# Patient Record
Sex: Male | Born: 1960 | Race: White | Hispanic: No | Marital: Married | Smoking: Current every day smoker
Health system: Southern US, Community
[De-identification: ages and names within clinical notes are randomized; demographics above are authoritative.]

## PROBLEM LIST (undated history)

## (undated) DIAGNOSIS — I10 Essential (primary) hypertension: Secondary | ICD-10-CM

## (undated) DIAGNOSIS — K5792 Diverticulitis of intestine, part unspecified, without perforation or abscess without bleeding: Secondary | ICD-10-CM

## (undated) HISTORY — PX: APPENDECTOMY: SHX54

## (undated) HISTORY — PX: HERNIA REPAIR: SHX51

---

## 2001-08-13 ENCOUNTER — Encounter: Payer: Self-pay | Admitting: Family Medicine

## 2001-08-13 ENCOUNTER — Ambulatory Visit (HOSPITAL_COMMUNITY): Admission: RE | Admit: 2001-08-13 | Discharge: 2001-08-13 | Payer: Self-pay | Admitting: Family Medicine

## 2002-05-19 ENCOUNTER — Emergency Department (HOSPITAL_COMMUNITY): Admission: EM | Admit: 2002-05-19 | Discharge: 2002-05-19 | Payer: Self-pay | Admitting: Emergency Medicine

## 2003-06-12 ENCOUNTER — Emergency Department (HOSPITAL_COMMUNITY): Admission: EM | Admit: 2003-06-12 | Discharge: 2003-06-12 | Payer: Self-pay | Admitting: *Deleted

## 2004-02-27 ENCOUNTER — Ambulatory Visit (HOSPITAL_COMMUNITY): Admission: RE | Admit: 2004-02-27 | Discharge: 2004-02-27 | Payer: Self-pay | Admitting: Preventative Medicine

## 2004-05-11 ENCOUNTER — Emergency Department (HOSPITAL_COMMUNITY): Admission: EM | Admit: 2004-05-11 | Discharge: 2004-05-11 | Payer: Self-pay | Admitting: Emergency Medicine

## 2006-05-27 ENCOUNTER — Emergency Department (HOSPITAL_COMMUNITY): Admission: EM | Admit: 2006-05-27 | Discharge: 2006-05-27 | Payer: Self-pay | Admitting: Emergency Medicine

## 2009-05-09 ENCOUNTER — Emergency Department (HOSPITAL_COMMUNITY): Admission: EM | Admit: 2009-05-09 | Discharge: 2009-05-09 | Payer: Self-pay | Admitting: Emergency Medicine

## 2012-05-14 ENCOUNTER — Other Ambulatory Visit (HOSPITAL_COMMUNITY): Payer: Self-pay | Admitting: Preventative Medicine

## 2012-05-14 ENCOUNTER — Ambulatory Visit (HOSPITAL_COMMUNITY): Payer: Self-pay

## 2012-05-14 ENCOUNTER — Ambulatory Visit (HOSPITAL_COMMUNITY)
Admission: RE | Admit: 2012-05-14 | Discharge: 2012-05-14 | Disposition: A | Discharge status: Home / Self Care | Payer: Managed Care, Other (non HMO) | Source: Ambulatory Visit | Attending: Preventative Medicine | Admitting: Preventative Medicine

## 2012-05-14 DIAGNOSIS — K429 Umbilical hernia without obstruction or gangrene: Secondary | ICD-10-CM | POA: Insufficient documentation

## 2012-05-14 DIAGNOSIS — R1031 Right lower quadrant pain: Secondary | ICD-10-CM | POA: Insufficient documentation

## 2012-05-14 DIAGNOSIS — R10829 Rebound abdominal tenderness, unspecified site: Secondary | ICD-10-CM

## 2012-05-14 DIAGNOSIS — K5732 Diverticulitis of large intestine without perforation or abscess without bleeding: Secondary | ICD-10-CM | POA: Insufficient documentation

## 2012-05-14 DIAGNOSIS — K5730 Diverticulosis of large intestine without perforation or abscess without bleeding: Secondary | ICD-10-CM | POA: Insufficient documentation

## 2012-05-14 DIAGNOSIS — R10819 Abdominal tenderness, unspecified site: Secondary | ICD-10-CM | POA: Insufficient documentation

## 2012-05-14 MED ORDER — IOHEXOL 300 MG/ML  SOLN
100.0000 mL | Freq: Once | INTRAMUSCULAR | Status: AC | PRN
  Administered 2012-05-14: 100 mL via INTRAVENOUS

## 2012-07-23 ENCOUNTER — Encounter (HOSPITAL_COMMUNITY): Payer: Self-pay | Admitting: *Deleted

## 2012-07-23 ENCOUNTER — Emergency Department (HOSPITAL_COMMUNITY): Payer: Managed Care, Other (non HMO)

## 2012-07-23 ENCOUNTER — Emergency Department (HOSPITAL_COMMUNITY)
Admission: EM | Admit: 2012-07-23 | Discharge: 2012-07-23 | Disposition: A | Discharge status: Home / Self Care | Payer: Managed Care, Other (non HMO) | Attending: Emergency Medicine | Admitting: Emergency Medicine

## 2012-07-23 DIAGNOSIS — R202 Paresthesia of skin: Secondary | ICD-10-CM

## 2012-07-23 DIAGNOSIS — R51 Headache: Secondary | ICD-10-CM | POA: Insufficient documentation

## 2012-07-23 DIAGNOSIS — R519 Headache, unspecified: Secondary | ICD-10-CM

## 2012-07-23 DIAGNOSIS — R209 Unspecified disturbances of skin sensation: Secondary | ICD-10-CM | POA: Insufficient documentation

## 2012-07-23 DIAGNOSIS — Z8719 Personal history of other diseases of the digestive system: Secondary | ICD-10-CM | POA: Insufficient documentation

## 2012-07-23 DIAGNOSIS — F17200 Nicotine dependence, unspecified, uncomplicated: Secondary | ICD-10-CM | POA: Insufficient documentation

## 2012-07-23 MED ORDER — ASPIRIN 325 MG PO TABS
325.0000 mg | ORAL_TABLET | Freq: Every day | ORAL | Status: DC
  Administered 2012-07-23: 325 mg via ORAL
  Filled 2012-07-23: qty 1

## 2012-07-23 MED ORDER — HYDROCODONE-ACETAMINOPHEN 5-325 MG PO TABS: 1.0000 | ORAL_TABLET | Freq: Once | ORAL | Status: DC

## 2012-07-23 MED ORDER — ONDANSETRON HCL 4 MG/2ML IJ SOLN: 4.0000 mg | Freq: Once | INTRAMUSCULAR | Status: DC

## 2012-07-23 NOTE — ED Provider Notes (Signed)
History     CSN: [id number]  Arrival date & time 07/23/12  0550   First MD Initiated Contact with Patient 07/23/12 [phone]      Chief Complaint  Patient presents with  . Headache  . Numbness    (Consider location/radiation/quality/duration/timing/severity/associated sxs/prior treatment) HPI HPI Comments: Eddie [name] is a 52 y.o. male who presents to the Emergency Department complaining of headache and tingling in his left arm and leg that began 2 hours ago. He has had similar tingling in the past without the headache.  Denies difficulty talking or swallowing, numbness or weakness in extremities, dizziness. Able to grip equally. He is right handed. History reviewed. No pertinent past medical history.  Past Surgical History  Procedure Laterality Date  . Hernia repair      History reviewed. No pertinent family history.  History  Substance Use Topics  . Smoking status: Current Every Day Smoker -- 1.00 packs/day  . Smokeless tobacco: Not on file  . Alcohol Use: Yes     Comment: occasional      Review of Systems  Constitutional: Negative for fever.       10 Systems reviewed and are negative for acute change except as noted in the HPI.  HENT: Negative for congestion.   Eyes: Negative for discharge and redness.  Respiratory: Negative for cough and shortness of breath.   Cardiovascular: Negative for chest pain.  Gastrointestinal: Negative for vomiting and abdominal pain.  Musculoskeletal: Negative for back pain.       Tingling in his left arm and leg  Skin: Negative for rash.  Neurological: Positive for headaches. Negative for syncope and numbness.  Psychiatric/Behavioral:       No behavior change.    Allergies  Review of patient's allergies indicates no known allergies.  Home Medications  No current outpatient prescriptions on file.  BP 185/92  Pulse 61  Temp(Src) 98.4 F (36.9 C) (Oral)  Resp 16  Ht 6' (1.829 m)  Wt 220 lb (99.791 kg)  BMI 29.83 kg/m2   SpO2 100%  Physical Exam  Nursing note and vitals reviewed. Constitutional: He is oriented to person, place, and time. He appears well-developed and well-nourished.  Awake, alert, nontoxic appearance.  HENT:  Head: Normocephalic and atraumatic.  Right Ear: External ear normal.  Left Ear: External ear normal.  Mouth/Throat: Oropharynx is clear and moist.  Eyes: EOM are normal. Pupils are equal, round, and reactive to light.  Neck: Neck supple.  Cardiovascular: Normal rate and intact distal pulses.   Pulmonary/Chest: Effort normal and breath sounds normal. He exhibits no tenderness.  Abdominal: Soft. Bowel sounds are normal. There is no tenderness. There is no rebound.  Musculoskeletal: He exhibits no tenderness.  Baseline ROM, no obvious new focal weakness.  Neurological: He is alert and oriented to person, place, and time. No cranial nerve deficit. Coordination normal.  Mental status and motor strength appears baseline for patient and situation.  Skin: No rash noted.  Psychiatric: He has a normal mood and affect.    ED Course  Procedures (including critical care time)  Ct Head Wo Contrast  07/23/2012   *RADIOLOGY REPORT*  Clinical Data: Severe headache.  CT HEAD WITHOUT CONTRAST  Technique:  Contiguous axial images were obtained from the base of the skull through the vertex without contrast.  Comparison: None.  Findings: No acute intracranial abnormality.  Specifically, no hemorrhage, hydrocephalus, mass lesion, acute infarction, or significant intracranial injury.  No acute calvarial abnormality.  Mucosal thickening within the  right maxillary sinus and scattered ethmoid air cells.  No air fluid levels.  Mastoids are clear.  IMPRESSION: No intracranial abnormality.   Original Report Authenticated By: Charlett Nose, M.D.     MDM  Patient presents with headache and tingling in the left side of his body. Given ASA. Given hydrocodone and zofran. CT negative. Advised to follow up with his  PCP. Pt stable in ED with no significant deterioration in condition.The patient appears reasonably screened and/or stabilized for discharge and I doubt any other medical condition or other [facility] requiring further screening, evaluation, or treatment in the ED at this time prior to discharge.  MDM Reviewed: nursing note and vitals Interpretation: CT scan           Nicoletta Dress. Colon Branch, MD 07/23/12 [phone]

## 2012-07-23 NOTE — ED Notes (Signed)
Pt reporting headache and tingling sensation on left side.  Reports symptoms started about 2 hours ago.  Denies dizziness.  No difficulty noted with ambulation.  Grasp equal bilaterally.

## 2013-02-28 ENCOUNTER — Encounter (HOSPITAL_COMMUNITY): Payer: Self-pay | Admitting: Emergency Medicine

## 2013-02-28 ENCOUNTER — Observation Stay (HOSPITAL_COMMUNITY)
Admission: EM | Admit: 2013-02-28 | Discharge: 2013-03-01 | Disposition: A | Discharge status: Home / Self Care | Payer: BC Managed Care – PPO | Attending: General Surgery | Admitting: General Surgery

## 2013-02-28 ENCOUNTER — Emergency Department (HOSPITAL_COMMUNITY): Payer: BC Managed Care – PPO

## 2013-02-28 ENCOUNTER — Inpatient Hospital Stay (HOSPITAL_COMMUNITY): Payer: BC Managed Care – PPO

## 2013-02-28 DIAGNOSIS — I10 Essential (primary) hypertension: Secondary | ICD-10-CM | POA: Insufficient documentation

## 2013-02-28 DIAGNOSIS — K3580 Unspecified acute appendicitis: Principal | ICD-10-CM | POA: Insufficient documentation

## 2013-02-28 DIAGNOSIS — K37 Unspecified appendicitis: Secondary | ICD-10-CM | POA: Diagnosis present

## 2013-02-28 HISTORY — DX: Essential (primary) hypertension: I10

## 2013-02-28 HISTORY — DX: Diverticulitis of intestine, part unspecified, without perforation or abscess without bleeding: K57.92

## 2013-02-28 LAB — COMPREHENSIVE METABOLIC PANEL
ALBUMIN: 3.7 g/dL (ref 3.5–5.2)
ALT: 19 U/L (ref 0–53)
AST: 17 U/L (ref 0–37)
Alkaline Phosphatase: 65 U/L (ref 39–117)
BUN: 11 mg/dL (ref 6–23)
CALCIUM: 9.7 mg/dL (ref 8.4–10.5)
CO2: 30 mEq/L (ref 19–32)
CREATININE: 1.06 mg/dL (ref 0.50–1.35)
Chloride: 97 mEq/L (ref 96–112)
GFR calc Af Amer: >90 mL/min (ref >90)
GFR calc non Af Amer: 80 mL/min — ABNORMAL LOW (ref >90)
Glucose, Bld: 114 mg/dL — ABNORMAL HIGH (ref 70–99)
Potassium: 3.4 mEq/L — ABNORMAL LOW (ref 3.7–5.3)
Sodium: 139 mEq/L (ref 137–147)
Total Bilirubin: 0.5 mg/dL (ref 0.3–1.2)
Total Protein: 7.3 g/dL (ref 6.0–8.3)

## 2013-02-28 LAB — CBC WITH DIFFERENTIAL/PLATELET
Basophils Absolute: 0 10*3/uL (ref 0.0–0.1)
Basophils Relative: 0 % (ref 0–1)
EOS ABS: 0.1 10*3/uL (ref 0.0–0.7)
EOS PCT: 1 % (ref 0–5)
HEMATOCRIT: 48.5 % (ref 39.0–52.0)
HEMOGLOBIN: 17.2 g/dL — AB (ref 13.0–17.0)
LYMPHS ABS: 1.4 10*3/uL (ref 0.7–4.0)
Lymphocytes Relative: 8 % — ABNORMAL LOW (ref 12–46)
MCH: 32.2 pg (ref 26.0–34.0)
MCHC: 35.5 g/dL (ref 30.0–36.0)
MCV: 90.8 fL (ref 78.0–100.0)
MONOS PCT: 6 % (ref 3–12)
Monocytes Absolute: 1 10*3/uL (ref 0.1–1.0)
Neutro Abs: 13.8 10*3/uL — ABNORMAL HIGH (ref 1.7–7.7)
Neutrophils Relative %: 85 % — ABNORMAL HIGH (ref 43–77)
Platelets: 144 10*3/uL — ABNORMAL LOW (ref 150–400)
RBC: 5.34 MIL/uL (ref 4.22–5.81)
RDW: 13.6 % (ref 11.5–15.5)
WBC: 16.3 10*3/uL — ABNORMAL HIGH (ref 4.0–10.5)

## 2013-02-28 LAB — LIPASE, BLOOD: Lipase: 37 U/L (ref 11–59)

## 2013-02-28 MED ORDER — HYDROMORPHONE HCL PF 1 MG/ML IJ SOLN
1.0000 mg | Freq: Once | INTRAMUSCULAR | Status: AC
  Administered 2013-02-28: 1 mg via INTRAVENOUS
  Filled 2013-02-28: qty 1

## 2013-02-28 MED ORDER — HYDROMORPHONE HCL PF 1 MG/ML IJ SOLN
1.0000 mg | Freq: Once | INTRAMUSCULAR | Status: AC
Start: 2013-02-28 — End: 2013-02-28
  Administered 2013-02-28: 1 mg via INTRAVENOUS
  Filled 2013-02-28: qty 1

## 2013-02-28 MED ORDER — POTASSIUM CHLORIDE 10 MEQ/100ML IV SOLN
10.0000 meq | INTRAVENOUS | Status: AC
  Administered 2013-03-01 (×3): 10 meq via INTRAVENOUS
  Filled 2013-02-28 (×2): qty 100

## 2013-02-28 MED ORDER — ONDANSETRON HCL 4 MG/2ML IJ SOLN
4.0000 mg | Freq: Once | INTRAMUSCULAR | Status: AC
  Administered 2013-02-28: 4 mg via INTRAVENOUS
  Filled 2013-02-28: qty 2

## 2013-02-28 MED ORDER — IOHEXOL 300 MG/ML  SOLN
50.0000 mL | Freq: Once | INTRAMUSCULAR | Status: AC | PRN
  Administered 2013-02-28: 50 mL via ORAL

## 2013-02-28 MED ORDER — SODIUM CHLORIDE 0.9 % IV BOLUS (SEPSIS)
1000.0000 mL | Freq: Once | INTRAVENOUS | Status: AC
  Administered 2013-02-28: 1000 mL via INTRAVENOUS

## 2013-02-28 MED ORDER — SODIUM CHLORIDE 0.9 % IV SOLN
3.0000 g | Freq: Once | INTRAVENOUS | Status: AC
  Administered 2013-03-01: 3 g via INTRAVENOUS
  Filled 2013-02-28: qty 3

## 2013-02-28 MED ORDER — SODIUM CHLORIDE 0.9 % IJ SOLN
INTRAMUSCULAR | Status: AC
  Filled 2013-02-28: qty 400

## 2013-02-28 MED ORDER — IOHEXOL 300 MG/ML  SOLN
100.0000 mL | Freq: Once | INTRAMUSCULAR | Status: AC | PRN
  Administered 2013-02-28: 100 mL via INTRAVENOUS

## 2013-02-28 NOTE — ED Notes (Signed)
abd pain,  Vomited x1 self induced   No diarrhea.

## 2013-02-28 NOTE — ED Provider Notes (Signed)
CSN: [id number]     Arrival date & time 02/28/13  1844 History  This chart was scribed for Gerhard Munchobert Devron Cohick, MD by Danella Maiersaroline Early, ED Scribe. This patient was seen in room APA19/APA19 and the patient's care was started at 9:14 PM.    Chief Complaint  Patient presents with  . Abdominal Pain   The history is provided by the patient. No language interpreter was used.   HPI Comments: Phill MutterRonnie W Schneck is a 53 y.o. male with a h/o diverticulitis who presents to the Emergency Department complaining of periumbilical abdominal pain onset 6 hours ago. Pt states he was having a normal day before the pain started. Pt reports one episode of self induced emesis with no relief. He reports this pain feels similar to diverticulitis but more severe. He denies h/o abdominal surgeries.   Past Medical History  Diagnosis Date  . Hypertension   . Diverticulitis    Past Surgical History  Procedure Laterality Date  . Hernia repair     History reviewed. No pertinent family history. History  Substance Use Topics  . Smoking status: Current Every Day Smoker -- 1.00 packs/day  . Smokeless tobacco: Not on file  . Alcohol Use: Yes     Comment: occasional    Review of Systems  Constitutional:       Per HPI, otherwise negative  HENT:       Per HPI, otherwise negative  Respiratory:       Per HPI, otherwise negative  Cardiovascular:       Per HPI, otherwise negative  Gastrointestinal: Negative for vomiting.  Endocrine:       Negative aside from HPI  Genitourinary:       Neg aside from HPI   Musculoskeletal:       Per HPI, otherwise negative  Skin: Negative.   Neurological: Negative for syncope.    Allergies  Review of patient's allergies indicates no known allergies.  Home Medications  No current outpatient prescriptions on file. BP 115/64  Pulse 64  Temp(Src) 98.1 F (36.7 C) (Oral)  Resp 18  Ht 6' (1.829 m)  Wt 216 lb (97.977 kg)  BMI 29.29 kg/m2  SpO2 100% Physical Exam  Nursing note  and vitals reviewed. Constitutional: He is oriented to person, place, and time. He appears well-developed. No distress.  HENT:  Head: Normocephalic and atraumatic.  Eyes: Conjunctivae and EOM are normal.  Cardiovascular: Normal rate and regular rhythm.   Pulmonary/Chest: Effort normal. No stridor. No respiratory distress.  Abdominal: Soft. He exhibits no distension. There is tenderness in the right upper quadrant and left upper quadrant.  Musculoskeletal: He exhibits no edema.  Neurological: He is alert and oriented to person, place, and time.  Skin: Skin is warm and dry.  Psychiatric: He has a normal mood and affect.    ED Course  Procedures (including critical care time) Medications - No data to display  COORDINATION OF CARE: 9:32 PM- Discussed treatment plan with pt. Pt agrees to plan.    Labs Review Labs Reviewed  COMPREHENSIVE METABOLIC PANEL - Abnormal; Notable for the following:    Potassium 3.4 (*)    Glucose, Bld 114 (*)    GFR calc non Af Amer 80 (*)    All other components within normal limits  CBC WITH DIFFERENTIAL - Abnormal; Notable for the following:    WBC 16.3 (*)    Hemoglobin 17.2 (*)    Platelets 144 (*)    Neutrophils Relative % 85 (*)  Neutro Abs 13.8 (*)    Lymphocytes Relative 8 (*)    All other components within normal limits  LIPASE, BLOOD   Imaging Review No results found.  EKG Interpretation    Date/Time:  Friday March 01 2013 00:19:45 EST Ventricular Rate:  55 PR Interval:  156 QRS Duration: 104 QT Interval:  436 QTC Calculation: 417 R Axis:   -87 Text Interpretation:  Sinus bradycardia Left axis deviation Inferior-posterior infarct , age undetermined Abnormal ECG No previous ECGs available Sinus bradycardia Left axis deviation T wave abnormality Abnormal ekg Confirmed by Gerhard Munch  MD [phone]) on 03/01/2013 12:28:02 AM          I discussed the patient's CT scan with our radiologist.  Subsequently discussed his case with  our surgeon.  This will be admitted for appendectomy tomorrow.   MDM   I personally performed the services described in this documentation, which was scribed in my presence. The recorded information has been reviewed and is accurate.   Patient presents with abdominal pain, is found to have acute appendicitis.  No peritoneal findings.  The patient admitted to surgery for next day procedure.  Gerhard Munch, MD 03/01/13 Jacinta Shoe

## 2013-03-01 ENCOUNTER — Encounter (HOSPITAL_COMMUNITY)
Admission: EM | Disposition: A | Discharge status: Home / Self Care | Payer: Self-pay | Source: Home / Self Care | Attending: General Surgery

## 2013-03-01 ENCOUNTER — Encounter (HOSPITAL_COMMUNITY): Payer: Self-pay | Admitting: *Deleted

## 2013-03-01 ENCOUNTER — Encounter (HOSPITAL_COMMUNITY): Payer: BC Managed Care – PPO | Admitting: Anesthesiology

## 2013-03-01 ENCOUNTER — Inpatient Hospital Stay (HOSPITAL_COMMUNITY): Payer: BC Managed Care – PPO | Admitting: Anesthesiology

## 2013-03-01 DIAGNOSIS — K3580 Unspecified acute appendicitis: Secondary | ICD-10-CM | POA: Diagnosis present

## 2013-03-01 HISTORY — PX: LAPAROSCOPIC APPENDECTOMY: SHX408

## 2013-03-01 LAB — SURGICAL PCR SCREEN
MRSA, PCR: NEGATIVE
Staphylococcus aureus: NEGATIVE

## 2013-03-01 SURGERY — APPENDECTOMY, LAPAROSCOPIC
Anesthesia: General | Site: Abdomen

## 2013-03-01 MED ORDER — ROCURONIUM BROMIDE 100 MG/10ML IV SOLN
INTRAVENOUS | Status: DC | PRN
  Administered 2013-03-01: 35 mg via INTRAVENOUS

## 2013-03-01 MED ORDER — PROPOFOL 10 MG/ML IV BOLUS
INTRAVENOUS | Status: DC | PRN
  Administered 2013-03-01: 150 mg via INTRAVENOUS

## 2013-03-01 MED ORDER — AMPICILLIN-SULBACTAM SODIUM 3 (2-1) G IJ SOLR
INTRAMUSCULAR | Status: AC
  Filled 2013-03-01: qty 3

## 2013-03-01 MED ORDER — LACTATED RINGERS IV SOLN
INTRAVENOUS | Status: DC
  Administered 2013-03-01: 09:00:00 via INTRAVENOUS
  Administered 2013-03-01: 1000 mL via INTRAVENOUS

## 2013-03-01 MED ORDER — KETOROLAC TROMETHAMINE 30 MG/ML IJ SOLN
30.0000 mg | Freq: Once | INTRAMUSCULAR | Status: AC
  Administered 2013-03-01: 30 mg via INTRAVENOUS

## 2013-03-01 MED ORDER — ENOXAPARIN SODIUM 40 MG/0.4ML ~~LOC~~ SOLN: 40.0000 mg | SUBCUTANEOUS | Status: DC

## 2013-03-01 MED ORDER — BUPIVACAINE HCL (PF) 0.5 % IJ SOLN
INTRAMUSCULAR | Status: AC
  Filled 2013-03-01: qty 30

## 2013-03-01 MED ORDER — LIDOCAINE HCL 1 % IJ SOLN
INTRAMUSCULAR | Status: DC | PRN
  Administered 2013-03-01: 50 mg via INTRADERMAL

## 2013-03-01 MED ORDER — FENTANYL CITRATE 0.05 MG/ML IJ SOLN
INTRAMUSCULAR | Status: DC | PRN
  Administered 2013-03-01 (×3): 50 ug via INTRAVENOUS

## 2013-03-01 MED ORDER — GLYCOPYRROLATE 0.2 MG/ML IJ SOLN
INTRAMUSCULAR | Status: AC
  Filled 2013-03-01: qty 1

## 2013-03-01 MED ORDER — ONDANSETRON HCL 4 MG/2ML IJ SOLN
4.0000 mg | Freq: Once | INTRAMUSCULAR | Status: AC
  Administered 2013-03-01: 4 mg via INTRAVENOUS

## 2013-03-01 MED ORDER — OXYCODONE-ACETAMINOPHEN 7.5-325 MG PO TABS: 1.0000 | ORAL_TABLET | ORAL | Status: DC | PRN

## 2013-03-01 MED ORDER — HYDROMORPHONE HCL PF 1 MG/ML IJ SOLN
1.0000 mg | INTRAMUSCULAR | Status: DC | PRN
  Administered 2013-03-01: 1 mg via INTRAVENOUS
  Filled 2013-03-01: qty 1

## 2013-03-01 MED ORDER — ONDANSETRON HCL 4 MG/2ML IJ SOLN: 4.0000 mg | Freq: Once | INTRAMUSCULAR | Status: DC | PRN

## 2013-03-01 MED ORDER — FENTANYL CITRATE 0.05 MG/ML IJ SOLN
25.0000 ug | INTRAMUSCULAR | Status: AC
  Administered 2013-03-01 (×2): 25 ug via INTRAVENOUS

## 2013-03-01 MED ORDER — MIDAZOLAM HCL 5 MG/5ML IJ SOLN
INTRAMUSCULAR | Status: DC | PRN
  Administered 2013-03-01: 2 mg via INTRAVENOUS

## 2013-03-01 MED ORDER — HYDROMORPHONE HCL PF 1 MG/ML IJ SOLN
1.0000 mg | Freq: Once | INTRAMUSCULAR | Status: AC
  Administered 2013-03-01: 1 mg via INTRAVENOUS
  Filled 2013-03-01: qty 1

## 2013-03-01 MED ORDER — BUPIVACAINE HCL (PF) 0.5 % IJ SOLN
INTRAMUSCULAR | Status: DC | PRN
  Administered 2013-03-01: 10 mL

## 2013-03-01 MED ORDER — FENTANYL CITRATE 0.05 MG/ML IJ SOLN
INTRAMUSCULAR | Status: AC
  Filled 2013-03-01: qty 2

## 2013-03-01 MED ORDER — HYDROCHLOROTHIAZIDE 25 MG PO TABS
12.5000 mg | ORAL_TABLET | Freq: Every day | ORAL | Status: DC
Start: 2013-03-01 — End: 2013-03-01
  Administered 2013-03-01: 12.5 mg via ORAL
  Filled 2013-03-01: qty 1

## 2013-03-01 MED ORDER — OXYCODONE-ACETAMINOPHEN 5-325 MG PO TABS
1.0000 | ORAL_TABLET | ORAL | Status: DC | PRN
  Administered 2013-03-01: 1 via ORAL
  Filled 2013-03-01: qty 1

## 2013-03-01 MED ORDER — LACTATED RINGERS IV SOLN: INTRAVENOUS | Status: DC

## 2013-03-01 MED ORDER — SODIUM CHLORIDE 0.9 % IR SOLN
Status: DC | PRN
  Administered 2013-03-01: 1000 mL

## 2013-03-01 MED ORDER — ONDANSETRON HCL 4 MG/2ML IJ SOLN: 4.0000 mg | Freq: Four times a day (QID) | INTRAMUSCULAR | Status: DC | PRN

## 2013-03-01 MED ORDER — FENTANYL CITRATE 0.05 MG/ML IJ SOLN: 25.0000 ug | INTRAMUSCULAR | Status: DC | PRN

## 2013-03-01 MED ORDER — SODIUM CHLORIDE 0.9 % IV SOLN
3.0000 g | Freq: Four times a day (QID) | INTRAVENOUS | Status: DC
  Administered 2013-03-01 (×2): 3 g via INTRAVENOUS
  Filled 2013-03-01 (×14): qty 3

## 2013-03-01 MED ORDER — MIDAZOLAM HCL 2 MG/2ML IJ SOLN
1.0000 mg | INTRAMUSCULAR | Status: DC | PRN
  Administered 2013-03-01: 2 mg via INTRAVENOUS

## 2013-03-01 MED ORDER — LIDOCAINE HCL (PF) 1 % IJ SOLN
INTRAMUSCULAR | Status: AC
  Filled 2013-03-01: qty 5

## 2013-03-01 MED ORDER — FENTANYL CITRATE 0.05 MG/ML IJ SOLN
INTRAMUSCULAR | Status: AC
  Filled 2013-03-01: qty 5

## 2013-03-01 MED ORDER — SUCCINYLCHOLINE CHLORIDE 20 MG/ML IJ SOLN
INTRAMUSCULAR | Status: DC | PRN
  Administered 2013-03-01: 170 mg via INTRAVENOUS

## 2013-03-01 MED ORDER — NEOSTIGMINE METHYLSULFATE 1 MG/ML IJ SOLN
INTRAMUSCULAR | Status: DC | PRN
  Administered 2013-03-01: 2 mg via INTRAVENOUS

## 2013-03-01 MED ORDER — MIDAZOLAM HCL 2 MG/2ML IJ SOLN
INTRAMUSCULAR | Status: AC
  Filled 2013-03-01: qty 2

## 2013-03-01 MED ORDER — PROPOFOL 10 MG/ML IV BOLUS
INTRAVENOUS | Status: AC
  Filled 2013-03-01: qty 20

## 2013-03-01 MED ORDER — ONDANSETRON HCL 4 MG PO TABS: 4.0000 mg | ORAL_TABLET | Freq: Four times a day (QID) | ORAL | Status: DC | PRN

## 2013-03-01 MED ORDER — ONDANSETRON HCL 4 MG/2ML IJ SOLN
4.0000 mg | Freq: Three times a day (TID) | INTRAMUSCULAR | Status: DC | PRN
Start: 2013-03-01 — End: 2013-03-01

## 2013-03-01 MED ORDER — SODIUM CHLORIDE 0.9 % IV SOLN
INTRAVENOUS | Status: AC
  Administered 2013-03-01: 02:00:00 via INTRAVENOUS

## 2013-03-01 MED ORDER — HYDROMORPHONE HCL PF 1 MG/ML IJ SOLN
1.0000 mg | INTRAMUSCULAR | Status: DC | PRN
Start: 2013-03-01 — End: 2013-03-01

## 2013-03-01 MED ORDER — NEOSTIGMINE METHYLSULFATE 1 MG/ML IJ SOLN: INTRAMUSCULAR | Status: DC | PRN

## 2013-03-01 MED ORDER — GLYCOPYRROLATE 0.2 MG/ML IJ SOLN
INTRAMUSCULAR | Status: DC | PRN
  Administered 2013-03-01: 0.2 mg via INTRAVENOUS

## 2013-03-01 MED ORDER — KETOROLAC TROMETHAMINE 30 MG/ML IJ SOLN
INTRAMUSCULAR | Status: AC
  Filled 2013-03-01: qty 1

## 2013-03-01 MED ORDER — GLYCOPYRROLATE 0.2 MG/ML IJ SOLN
0.2000 mg | Freq: Once | INTRAMUSCULAR | Status: AC
  Administered 2013-03-01: 0.2 mg via INTRAVENOUS

## 2013-03-01 MED ORDER — ONDANSETRON HCL 4 MG/2ML IJ SOLN
INTRAMUSCULAR | Status: AC
  Filled 2013-03-01: qty 2

## 2013-03-01 SURGICAL SUPPLY — 55 items
BAG HAMPER (MISCELLANEOUS) ×2 IMPLANT
BAG SPEC RTRVL LRG 6X4 10 (ENDOMECHANICALS) ×1
CLOTH BEACON ORANGE TIMEOUT ST (SAFETY) ×2 IMPLANT
COVER LIGHT HANDLE STERIS (MISCELLANEOUS) ×4 IMPLANT
CUTTER FLEX LINEAR 45M (STAPLE) IMPLANT
CUTTER LINEAR ENDO 35 ETS (STAPLE) IMPLANT
CUTTER LINEAR ENDO 35 ETS TH (STAPLE) ×1 IMPLANT
DECANTER SPIKE VIAL GLASS SM (MISCELLANEOUS) ×2 IMPLANT
DISSECTOR BLUNT TIP ENDO 5MM (MISCELLANEOUS) IMPLANT
DURAPREP 26ML APPLICATOR (WOUND CARE) ×2 IMPLANT
ELECT REM PT RETURN 9FT ADLT (ELECTROSURGICAL) ×2
ELECTRODE REM PT RTRN 9FT ADLT (ELECTROSURGICAL) ×1 IMPLANT
FILTER SMOKE EVAC LAPAROSHD (FILTER) ×2 IMPLANT
FORMALIN 10 PREFIL 120ML (MISCELLANEOUS) ×2 IMPLANT
GLOVE BIO SURGEON STRL SZ7.5 (GLOVE) ×2 IMPLANT
GLOVE BIO SURGEON STRL SZ8.5 (GLOVE) ×1 IMPLANT
GLOVE BIOGEL PI IND STRL 7.0 (GLOVE) IMPLANT
GLOVE BIOGEL PI IND STRL 8.5 (GLOVE) IMPLANT
GLOVE BIOGEL PI INDICATOR 7.0 (GLOVE) ×2
GLOVE BIOGEL PI INDICATOR 8.5 (GLOVE) ×1
GLOVE ECLIPSE 7.0 STRL STRAW (GLOVE) IMPLANT
GLOVE INDICATOR 7.5 STRL GRN (GLOVE) ×1 IMPLANT
GLOVE SS BIOGEL STRL SZ 6.5 (GLOVE) IMPLANT
GLOVE SUPERSENSE BIOGEL SZ 6.5 (GLOVE) ×1
GOWN STRL REUS W/TWL LRG LVL3 (GOWN DISPOSABLE) ×3 IMPLANT
GOWN STRL REUS W/TWL XL LVL3 (GOWN DISPOSABLE) ×2 IMPLANT
INST SET LAPROSCOPIC AP (KITS) ×2 IMPLANT
IV NS IRRIG 3000ML ARTHROMATIC (IV SOLUTION) IMPLANT
KIT ROOM TURNOVER APOR (KITS) ×2 IMPLANT
MANIFOLD NEPTUNE II (INSTRUMENTS) ×2 IMPLANT
NDL INSUFFLATION 14GA 120MM (NEEDLE) ×1 IMPLANT
NEEDLE INSUFFLATION 14GA 120MM (NEEDLE) ×2 IMPLANT
NS IRRIG 1000ML POUR BTL (IV SOLUTION) ×2 IMPLANT
PACK LAP CHOLE LZT030E (CUSTOM PROCEDURE TRAY) ×2 IMPLANT
PAD ARMBOARD 7.5X6 YLW CONV (MISCELLANEOUS) ×2 IMPLANT
PENCIL HANDSWITCHING (ELECTRODE) ×1 IMPLANT
POUCH SPECIMEN RETRIEVAL 10MM (ENDOMECHANICALS) ×2 IMPLANT
RELOAD /EVU35 (ENDOMECHANICALS) IMPLANT
RELOAD 45 VASCULAR/THIN (ENDOMECHANICALS) IMPLANT
RELOAD CUTTER ETS 35MM STAND (ENDOMECHANICALS) IMPLANT
RELOAD STAPLE 45 2.5 WHT GRN (ENDOMECHANICALS) IMPLANT
SCALPEL HARMONIC ACE (MISCELLANEOUS) ×2 IMPLANT
SET BASIN LINEN APH (SET/KITS/TRAYS/PACK) ×2 IMPLANT
SET TUBE IRRIG SUCTION NO TIP (IRRIGATION / IRRIGATOR) IMPLANT
SLEEVE ENDOPATH XCEL 5M (ENDOMECHANICALS) ×1 IMPLANT
SPONGE GAUZE 2X2 8PLY STRL LF (GAUZE/BANDAGES/DRESSINGS) ×6 IMPLANT
STAPLER VISISTAT (STAPLE) ×2 IMPLANT
SUT VICRYL 0 UR6 27IN ABS (SUTURE) ×2 IMPLANT
TRAY FOLEY CATH 16FR SILVER (SET/KITS/TRAYS/PACK) ×2 IMPLANT
TROCAR ENDO BLADELESS 11MM (ENDOMECHANICALS) ×2 IMPLANT
TROCAR ENDO BLADELESS 12MM (ENDOMECHANICALS) ×2 IMPLANT
TROCAR XCEL NON-BLD 5MMX100MML (ENDOMECHANICALS) ×2 IMPLANT
TUBING INSUFFLATION (TUBING) ×2 IMPLANT
WARMER LAPAROSCOPE (MISCELLANEOUS) ×2 IMPLANT
YANKAUER SUCT 12FT TUBE ARGYLE (SUCTIONS) ×2 IMPLANT

## 2013-03-01 NOTE — Discharge Instructions (Signed)
Laparoscopic Appendectomy °Care After °Refer to this sheet in the next few weeks. These instructions provide you with information on caring for yourself after your procedure. Your caregiver may also give you more specific instructions. Your treatment has been planned according to current medical practices, but problems sometimes occur. Call your caregiver if you have any problems or questions after your procedure. °HOME CARE INSTRUCTIONS °· Do not drive while taking narcotic pain medicines. °· Use stool softener if you become constipated from your pain medicines. °· Change your bandages (dressings) as directed. °· Keep your wounds clean and dry. You may wash the wounds gently with soap and water. Gently pat the wounds dry with a clean towel. °· Do not take baths, swim, or use hot tubs for 10 days, or as instructed by your caregiver. °· Only take over-the-counter or prescription medicines for pain, discomfort, or fever as directed by your caregiver. °· You may continue your normal diet as directed. °· Do not lift more than 10 pounds (4.5 kg) or play contact sports for 3 weeks, or as directed. °· Slowly increase your activity after surgery. °· Take deep breaths to avoid getting a lung infection (pneumonia). °SEEK MEDICAL CARE IF: °· You have redness, swelling, or increasing pain in your wounds. °· You have pus coming from your wounds. °· You have drainage from a wound that lasts longer than 1 day. °· You notice a bad smell coming from the wounds or dressing. °· Your wound edges break open after stitches (sutures) have been removed. °· You notice increasing pain in the shoulders (shoulder strap areas) or near your shoulder blades. °· You develop dizzy episodes or fainting while standing. °· You develop shortness of breath. °· You develop persistent nausea or vomiting. °· You cannot control your bowel functions or lose your appetite. °· You develop diarrhea. °SEEK IMMEDIATE MEDICAL CARE IF:  °· You have a fever. °· You  develop a rash. °· You have difficulty breathing or sharp pains in your chest. °· You develop any reaction or side effects to medicines given. °MAKE SURE YOU: °· Understand these instructions. °· Will watch your condition. °· Will get help right away if you are not doing well or get worse. °Document Released: 01/17/2005 Document Revised: 04/11/2011 Document Reviewed: 07/27/2010 °ExitCare® Patient Information ©2014 [employer]. ° °

## 2013-03-01 NOTE — Discharge Summary (Signed)
Physician Discharge Summary  Patient ID: Eddie [name] MRN: [id number] DOB/AGE: 52-Feb-1962 52 y.o.  Admit date: 02/28/2013 Discharge date: 03/01/2013  Admission Diagnoses: Acute Appendicitis  Discharge Diagnoses: Same Active Problems:   Appendicitis   Acute appendicitis   Discharged Condition: good  Hospital Course: Patient is [name] 53 year old white male who presented emergency room with same day history of worsening right lower quadrant abdominal pain. CT scan the abdomen and pelvis revealed acute appendicitis. He was also noted to be hypokalemic. He was brought into the hospital and received potassium supplementation and was started on IV Unasyn. He subsequently underwent laparoscopic appendectomy on 03/01/2013. Tolerated the procedure well. His postoperative course has been unremarkable. His diet was advanced without difficulty. The patient is being discharged home on 03/01/2013 in good improving condition.  Treatments: surgery: Laparoscopic appendectomy on 03/01/2013  Discharge Exam: Blood pressure 121/70, pulse 74, temperature 98.8 F (37.1 C), temperature source Oral, resp. rate 17, height 6' (1.829 m), weight 97.523 kg (215 lb), SpO2 99.00%. General appearance: alert, cooperative and no distress Resp: clear to auscultation bilaterally Cardio: regular rate and rhythm, S1, S2 normal, no murmur, click, rub or gallop GI: Soft. Dressings dry and intact.  Disposition: 01-Home or Self Care     Medication List         hydrochlorothiazide 12.5 MG tablet  Commonly known as:  HYDRODIURIL  Take 12.5 mg by mouth daily.     hydrOXYzine 25 MG tablet  Commonly known as:  ATARAX/VISTARIL  Take 25 mg by mouth every 8 (eight) hours as needed.     oxyCODONE-acetaminophen 7.5-325 MG per tablet  Commonly known as:  PERCOCET  Take 1-2 tablets by mouth every 4 (four) hours as needed.           Follow-up Information   Follow up with Dalia HeadingJENKINS,Mayco Walrond A, MD. Schedule an appointment as soon  as possible for [name] visit on 03/07/2013.   Specialty:  General Surgery   Contact information:   [address] [place] [id number] [phone]       Signed: [name],Eddie [name] 03/01/2013, 1:58 PM

## 2013-03-01 NOTE — Progress Notes (Signed)
Pt in no acute distress. Resting in room. Pt has been NPO since midnight. Will continue to monitor.

## 2013-03-01 NOTE — Anesthesia Procedure Notes (Signed)
Procedure Name: Intubation Date/Time: 03/01/2013 8:42 AM Performed by: Despina HiddenIDACAVAGE, Raffaela Ladley J Pre-anesthesia Checklist: Emergency Drugs available, Suction available, Patient being monitored and Patient identified Patient Re-evaluated:Patient Re-evaluated prior to inductionOxygen Delivery Method: Circle system utilized Preoxygenation: Pre-oxygenation with 100% oxygen Intubation Type: IV induction, Rapid sequence and Cricoid Pressure applied Ventilation: Mask ventilation without difficulty Laryngoscope Size: Mac and 3 Grade View: Grade I Tube type: Oral Number of attempts: 1 Airway Equipment and Method: Stylet Placement Confirmation: ETT inserted through vocal cords under direct vision,  positive ETCO2 and breath sounds checked- equal and bilateral Secured at: 22 cm Tube secured with: Tape Dental Injury: Teeth and Oropharynx as per pre-operative assessment

## 2013-03-01 NOTE — Anesthesia Preprocedure Evaluation (Signed)
Anesthesia Evaluation  Patient identified by MRN, date of birth, ID band Patient awake    Reviewed: Allergy & Precautions, H&P , NPO status , Patient's Chart, lab work & pertinent test results  Airway Mallampati: II TM Distance: >3 FB Neck ROM: Full    Dental  (+) Edentulous Upper and Partial Lower   Pulmonary Current Smoker,  breath sounds clear to auscultation        Cardiovascular hypertension, Pt. on medications Rhythm:Regular Rate:Normal     Neuro/Psych    GI/Hepatic negative GI ROS,   Endo/Other    Renal/GU      Musculoskeletal   Abdominal   Peds  Hematology   Anesthesia Other Findings   Reproductive/Obstetrics                           Anesthesia Physical Anesthesia Plan  ASA: II  Anesthesia Plan: General   Post-op Pain Management:    Induction: Intravenous, Rapid sequence and Cricoid pressure planned  Airway Management Planned: Oral ETT  Additional Equipment:   Intra-op Plan:   Post-operative Plan: Extubation in OR  Informed Consent: I have reviewed the patients History and Physical, chart, labs and discussed the procedure including the risks, benefits and alternatives for the proposed anesthesia with the patient or authorized representative who has indicated his/her understanding and acceptance.     Plan Discussed with:   Anesthesia Plan Comments:         Anesthesia Quick Evaluation

## 2013-03-01 NOTE — Care Management Note (Signed)
    Page 1 of 1   03/01/2013     1:35:28 PM   CARE MANAGEMENT NOTE 03/01/2013  Patient:  Eddie [name],Eddie [name]   Account Number:  [id number]  Date Initiated:  03/08/2013  Documentation initiated by:  Sharrie RothmanBLACKWELL,Melena Hayes C  Subjective/Objective Assessment:   Pt admitted from with acute appendicitis. Pt lives with his wife and will return home at discharge. Pt is independent with ADL's.     Action/Plan:   No CM needs noted.   Anticipated DC Date:  03/02/2013   Anticipated DC Plan:  HOME/SELF CARE      DC Planning Services  CM consult      Choice offered to / List presented to:             Status of service:  Completed, signed off Medicare Important Message given?   (If response is "NO", the following Medicare IM given date fields will be blank) Date Medicare IM given:   Date Additional Medicare IM given:    Discharge Disposition:  HOME/SELF CARE  Per UR Regulation:    If discussed at Long Length of Stay Meetings, dates discussed:    Comments:  03/01/13 1335 Arlyss Queenammy Aahana Elza, RN BSN CM

## 2013-03-01 NOTE — Progress Notes (Signed)
Pt is to be discharged home today. Pt is in NAD, IV is out, all paperwork has been reviewed/discussed with patient, and there are no questions/concerns at this time. Assessment is unchanged from this morning. Pt is to be accompanied downstairs by staff and family. Pt refused wheelchair will walk down with pt.

## 2013-03-01 NOTE — Op Note (Signed)
Patient:  Eddie [name]  DOB:  04-20-1960  MRN:  [id number]   Preop Diagnosis:  Acute appendicitis  Postop Diagnosis:  Same  Procedure:  Laparoscopic appendectomy  Surgeon:  Franky MachoMark Olaoluwa Grieder, M.D.  Anes:  General endotracheal  Indications:  Patient is a 53 year old white male who presents with right lower quadrant abdominal pain. CT scan of the abdomen reveals acute appendicitis. The risks and benefits of the procedure including bleeding, infection, and the possibility of an open procedure were fully explained to the patient, who gave informed consent.  Procedure note:  The patient is placed the supine position. After induction of general endotracheal anesthesia, the abdomen was prepped and draped using usual sterile technique with DuraPrep. Surgical site confirmation was performed.  A supraumbilical incision was made down to the fascia. A Veress needle was introduced into the abdominal cavity and confirmation of placement was done using the saline drop test. The abdomen was then insufflated to 16 mm mercury pressure. An 11 mm trocar was introduced into the abdominal cavity under direct visualization the difficulty. The patient was placed in deeper Trendelenburg position and an additional 12 mm trocar was placed the suprapubic region and a 5 mm trocar was placed in the left lower quadrant region. As the appendix was retrocecal in nature, and an additional 5 mm trocar was placed the right lower quadrant region. The appendix was freed away from its retroperitoneal attachments bluntly and also using the harmonic scalpel. The mesoappendix was divided using the harmonic scalpel. A vascular Endo GIA was placed across the base the appendix and fired. The appendix was then removed using an Endo Catch bag and sent to pathology further examination. The staple line was inspected and noted within normal limits. The right lower quadrant was then irrigated normal saline and all fluid and air were evacuated from  the abdominal cavity prior to removal of the trochars.  All wounds were irrigated with normal saline. All wounds were injected with 0.5% Sensorcaine. The supraumbilical fascia as well as suprapubic fascia were reapproximated using 0 Vicryl interrupted sutures. All skin incisions were closed using staples. Betadine ointment and dry sterile dressings were applied.  All tape and needle counts were correct the end of the procedure. Patient was extubated in the operating room and transferred to PACU in stable condition.  Complications:  None  EBL:  Minimal  Specimen:  Appendix

## 2013-03-01 NOTE — Progress Notes (Signed)
Pt ambulated in hallway, independently.  Ambulated approximately 250 feet.  Tolerated well.

## 2013-03-01 NOTE — H&P (Signed)
Eddie [name] is an 53 y.o. male.   Chief Complaint: Right lower corner abdominal pain HPI: Patient is a 53 year old white male who less than 24 hours ago began experiencing right lower corner abdominal pain. This was different than his previous episode of diverticulitis. CT scan the abdomen reveals acute appendicitis without perforation. It is retrocecal in nature.  Past Medical History  Diagnosis Date  . Hypertension   . Diverticulitis     Past Surgical History  Procedure Laterality Date  . Hernia repair      History reviewed. No pertinent family history. Social History:  reports that he has been smoking.  He does not have any smokeless tobacco history on file. He reports that he drinks alcohol. He reports that he does not use illicit drugs.  Allergies: No Known Allergies  Medications Prior to Admission  Medication Sig Dispense Refill  . hydrochlorothiazide (HYDRODIURIL) 12.5 MG tablet Take 12.5 mg by mouth daily.      . hydrOXYzine (ATARAX/VISTARIL) 25 MG tablet Take 25 mg by mouth every 8 (eight) hours as needed.        Results for orders placed during the hospital encounter of 02/28/13 (from the past 48 hour(s))  COMPREHENSIVE METABOLIC PANEL     Status: Abnormal   Collection Time    02/28/13  8:42 PM      Result Value Range   Sodium 139  137 - 147 mEq/L   Potassium 3.4 (*) 3.7 - 5.3 mEq/L   Chloride 97  96 - 112 mEq/L   CO2 30  19 - 32 mEq/L   Glucose, Bld 114 (*) 70 - 99 mg/dL   BUN 11  6 - 23 mg/dL   Creatinine, Ser 1.06  0.50 - 1.35 mg/dL   Calcium 9.7  8.4 - 10.5 mg/dL   Total Protein 7.3  6.0 - 8.3 g/dL   Albumin 3.7  3.5 - 5.2 g/dL   AST 17  0 - 37 U/L   ALT 19  0 - 53 U/L   Alkaline Phosphatase 65  39 - 117 U/L   Total Bilirubin 0.5  0.3 - 1.2 mg/dL   GFR calc non Af Amer 80 (*) >90 mL/min   GFR calc Af Amer >90  >90 mL/min   Comment: (NOTE)     The eGFR has been calculated using the CKD EPI equation.     This calculation has not been validated in all  clinical situations.     eGFR's persistently <90 mL/min signify possible Chronic Kidney     Disease.  CBC WITH DIFFERENTIAL     Status: Abnormal   Collection Time    02/28/13  8:42 PM      Result Value Range   WBC 16.3 (*) 4.0 - 10.5 K/uL   RBC 5.34  4.22 - 5.81 MIL/uL   Hemoglobin 17.2 (*) 13.0 - 17.0 g/dL   HCT 48.5  39.0 - 52.0 %   MCV 90.8  78.0 - 100.0 fL   MCH 32.2  26.0 - 34.0 pg   MCHC 35.5  30.0 - 36.0 g/dL   RDW 13.6  11.5 - 15.5 %   Platelets 144 (*) 150 - 400 K/uL   Neutrophils Relative % 85 (*) 43 - 77 %   Neutro Abs 13.8 (*) 1.7 - 7.7 K/uL   Lymphocytes Relative 8 (*) 12 - 46 %   Lymphs Abs 1.4  0.7 - 4.0 K/uL   Monocytes Relative 6  3 - 12 %  Monocytes Absolute 1.0  0.1 - 1.0 K/uL   Eosinophils Relative 1  0 - 5 %   Eosinophils Absolute 0.1  0.0 - 0.7 K/uL   Basophils Relative 0  0 - 1 %   Basophils Absolute 0.0  0.0 - 0.1 K/uL  LIPASE, BLOOD     Status: None   Collection Time    02/28/13  9:30 PM      Result Value Range   Lipase 37  11 - 59 U/L   Dg Chest 2 View  03/01/2013   CLINICAL DATA:  Appendicitis, preoperative evaluation  EXAM: CHEST  2 VIEW  COMPARISON:  None.  FINDINGS: Low volume chest exam with basilar atelectasis, worse on the right. No effusion or pneumothorax. Normal heart size and vascularity. Trachea midline.  IMPRESSION: Bibasilar atelectasis and low lung volumes.   Electronically Signed   By: Daryll Brod M.D.   On: 03/01/2013 00:06   Ct Abdomen Pelvis W Contrast  02/28/2013   CLINICAL DATA:  Periumbilical abdominal pain, prior diverticulitis  EXAM: CT ABDOMEN AND PELVIS WITH CONTRAST  TECHNIQUE: Multidetector CT imaging of the abdomen and pelvis was performed using the standard protocol following bolus administration of intravenous contrast.  CONTRAST:  75mL OMNIPAQUE IOHEXOL 300 MG/ML SOLN, 124mL OMNIPAQUE IOHEXOL 300 MG/ML SOLN  COMPARISON:  05/14/2012  FINDINGS: Bibasilar atelectasis, worse in the right lower lobe. Normal heart size. No  pericardial or pleural effusion. Nonspecific distention of the distal esophagus. No significant hiatal hernia.  Abdomen: Liver, gallbladder, biliary system, pancreas, spleen, adrenal glands, and kidneys are within normal limits for age and demonstrate no acute process.  Slight prominence of proximal small bowel involving the duodenum and jejunum, nonspecific. Negative for obstruction, dilatation, ileus, or free air. Scattered colonic diverticulosis.  Appendix demonstrates diffuse surrounding inflammation/ edema and distension. Appendix measures 14 mm in diameter, image 52. Findings compatible with acute appendicitis, nonruptured. Appendix is retrocecal in position.  Scattered colonic diverticulosis diffusely.  No abdominal fluid collection, hemorrhage, abscess, or adenopathy.  Negative for aneurysm.  Pelvis: Iliac calcific atherosclerosis. Sigmoid diverticulosis noted without acute inflammation. Urinary bladder unremarkable. No pelvic free fluid, fluid collection, hemorrhage, abscess, adenopathy, inguinal abnormality, or hernia.  Diffuse degenerative changes noted of the spine. Bilateral L5 pars defects evident with L5 on S1 anterior slippage measuring 14 mm and associated advanced degenerative disc disease. No other acute osseous finding.  IMPRESSION: Acute nonruptured appendicitis, retrocecal in position.  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 Carmin Muskrat , who verbally acknowledged these results.   Electronically Signed   By: Daryll Brod M.D.   On: 02/28/2013 23:44    Review of Systems  Constitutional: Positive for malaise/fatigue.  Respiratory: Negative.   Cardiovascular: Negative.   Gastrointestinal: Positive for abdominal pain.  Genitourinary: Negative.   Musculoskeletal: Negative.   All other systems  reviewed and are negative.    Blood pressure 111/69, pulse 69, temperature 98.1 F (36.7 C), temperature source Oral, resp. rate 18, height 6' (1.829 m), weight 97.796 kg (215 lb 9.6 oz), SpO2 97.00%. Physical Exam  Constitutional: He is oriented to person, place, and time. He appears well-developed and well-nourished.  HENT:  Head: Normocephalic and atraumatic.  Neck: Normal range of motion. Neck supple.  Cardiovascular: Normal rate, regular rhythm and normal heart sounds.   Respiratory: Effort normal and  breath sounds normal.  GI: Soft. He exhibits no distension and no mass. There is tenderness.  Tender in right lower quadrant to deep palpation. No rigidity noted.  Neurological: He is alert and oriented to person, place, and time.  Skin: Skin is warm and dry.     Assessment/Plan Impression: Acute appendicitis, hypokalemia Plan: Patient will be taken to the operating room for laparoscopic appendectomy after his hypokalemia has been treated. He has been started on IV Unasyn. The risks and benefits of the procedure including bleeding, infection, and the possibility of an open procedure were fully explained to the patient, who gave informed consent.  Aviance Cooperwood A 03/01/2013, 7:01 AM

## 2013-03-01 NOTE — Progress Notes (Signed)
03/01/13 0755  OBSTRUCTIVE SLEEP APNEA  Have you ever been diagnosed with sleep apnea through a sleep study? No  Do you snore loudly (loud enough to be heard through closed doors)?  1  Do you often feel tired, fatigued, or sleepy during the daytime? 0  Has anyone observed you stop breathing during your sleep? 0  Do you have, or are you being treated for high blood pressure? 1  BMI more than 35 kg/m2? 0  Age over 52 years old? 1  Neck circumference greater than 40 cm/18 inches? 0  Gender: 1  Obstructive Sleep Apnea Score 4

## 2013-03-01 NOTE — Transfer of Care (Signed)
Immediate Anesthesia Transfer of Care Note  Patient: Eddie [name]  Procedure(s) Performed: Procedure(s): APPENDECTOMY LAPAROSCOPIC (N/A)  Patient Location: PACU  Anesthesia Type:General  Level of Consciousness: awake and patient cooperative  Airway & Oxygen Therapy: Patient Spontanous Breathing and Patient connected to face mask oxygen  Post-op Assessment: Report given to PACU RN, Post -op Vital signs reviewed and stable and Patient moving all extremities  Post vital signs: Reviewed and stable  Complications: No apparent anesthesia complications

## 2013-03-01 NOTE — Anesthesia Postprocedure Evaluation (Signed)
  Anesthesia Post-op Note  Patient: Eddie [name]  Procedure(s) Performed: Procedure(s): APPENDECTOMY LAPAROSCOPIC (N/A)  Patient Location: PACU  Anesthesia Type:General  Level of Consciousness: awake, alert , oriented and patient cooperative  Airway and Oxygen Therapy: Patient Spontanous Breathing  Post-op Pain: 3 /10, mild  Post-op Assessment: Post-op Vital signs reviewed, Patient's Cardiovascular Status Stable, Respiratory Function Stable, Patent Airway, No signs of Nausea or vomiting and Pain level controlled  Post-op Vital Signs: Reviewed and stable  Complications: No apparent anesthesia complications

## 2013-03-04 ENCOUNTER — Encounter (HOSPITAL_COMMUNITY): Payer: Self-pay | Admitting: General Surgery

## 2015-02-06 IMAGING — CT CT ABD-PELV W/ CM
2 of 5 series · 16 of 46 positions shown, 18 images · IV contrast (Omnipaque 300)
Comparison: 05/14/2012

CLINICAL DATA: Periumbilical abdominal pain, prior diverticulitis

EXAM:
CT ABDOMEN AND PELVIS WITH CONTRAST
TECHNIQUE: Multidetector CT imaging of the abdomen and pelvis was performed
using the standard protocol following bolus administration of
intravenous contrast.
CONTRAST:  50mL OMNIPAQUE IOHEXOL 300 MG/ML SOLN, 100mL OMNIPAQUE
IOHEXOL 300 MG/ML SOLN

[Series 2: abd_pel_with 5.0 b40f · axial · 0.85mm/px · z∈[-538,-123]mm · 13 of 95 slices shown, 15 images]
[im 6/95  soft-tissue]
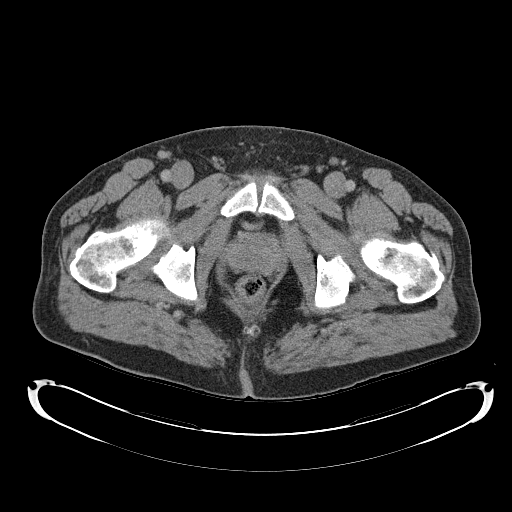
[im 6/95  bone]
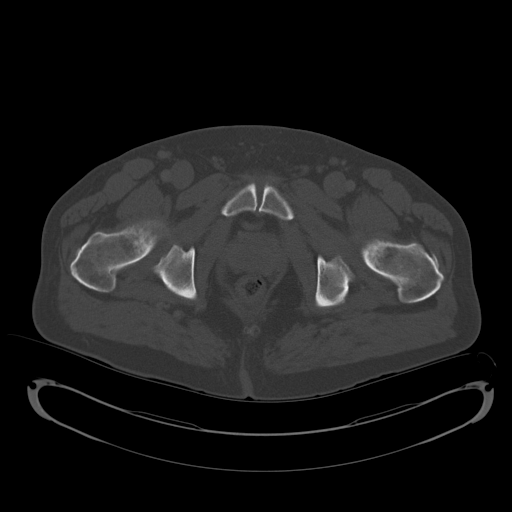
[im 12/95  soft-tissue]
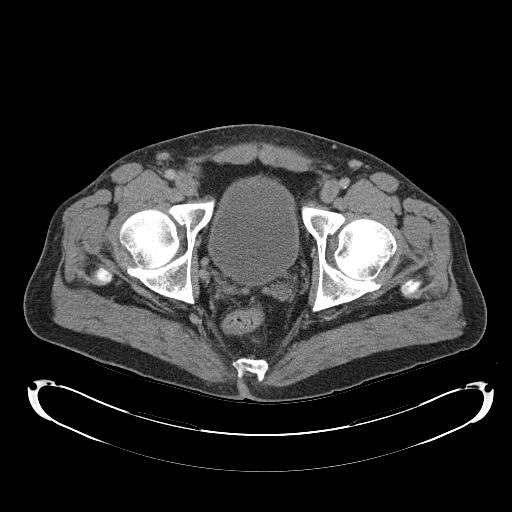
[im 23/95  soft-tissue]
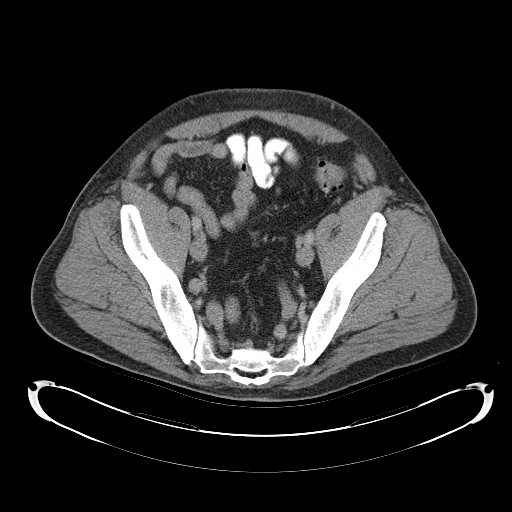
[im 28/95  soft-tissue]
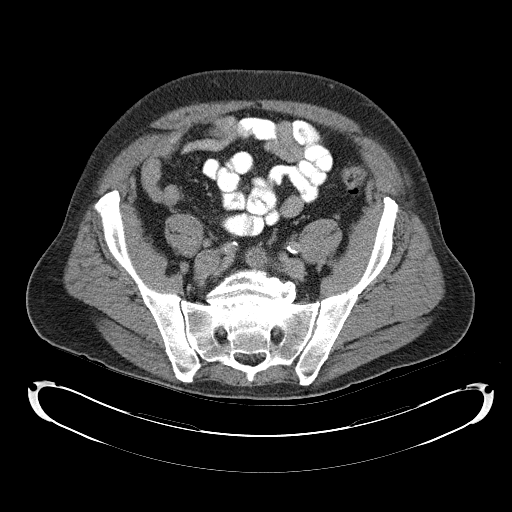
[im 34/95  soft-tissue]
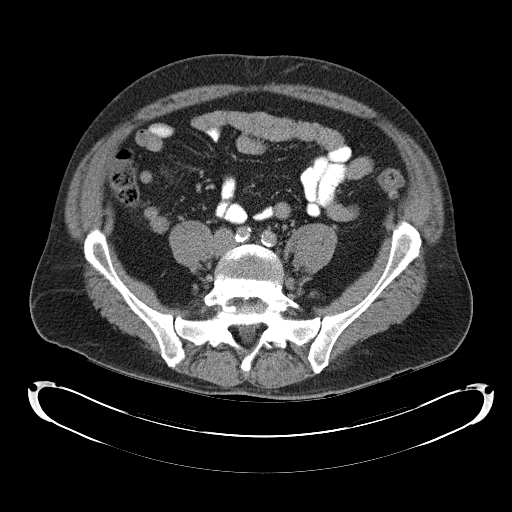
[im 39/95  soft-tissue]
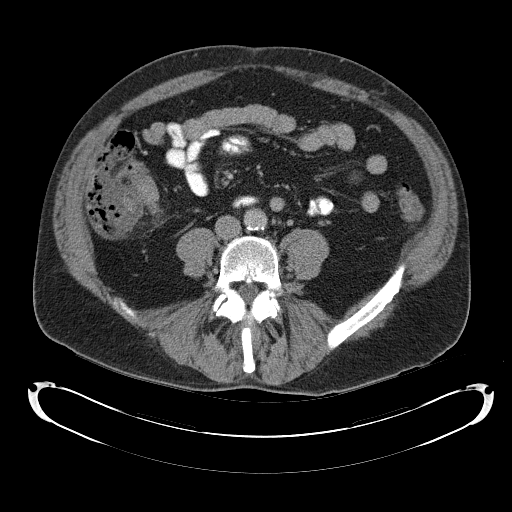
[im 50/95  soft-tissue]
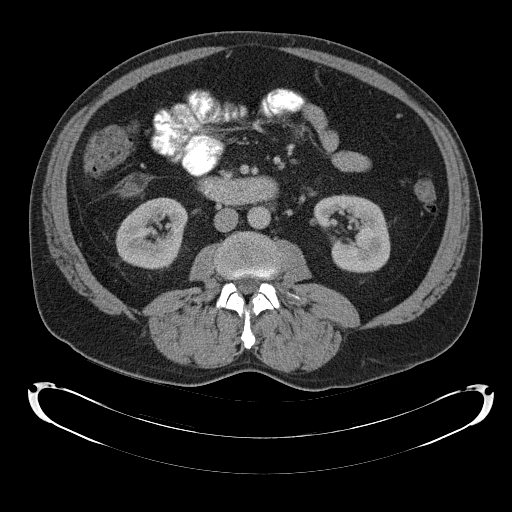
[im 56/95  soft-tissue]
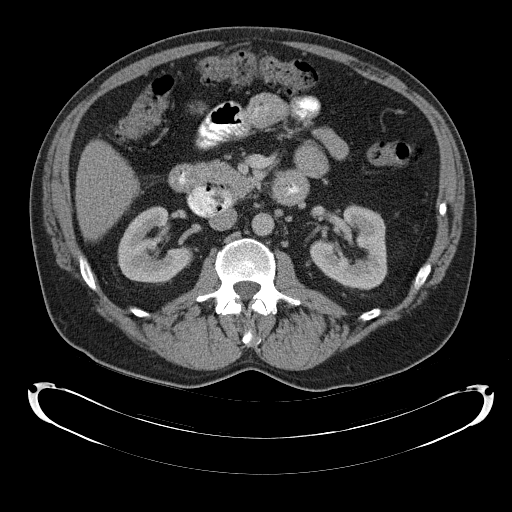
[im 61/95  soft-tissue]
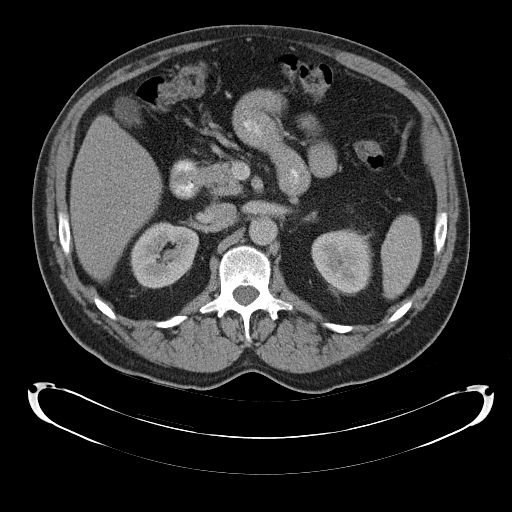
[im 61/95  bone]
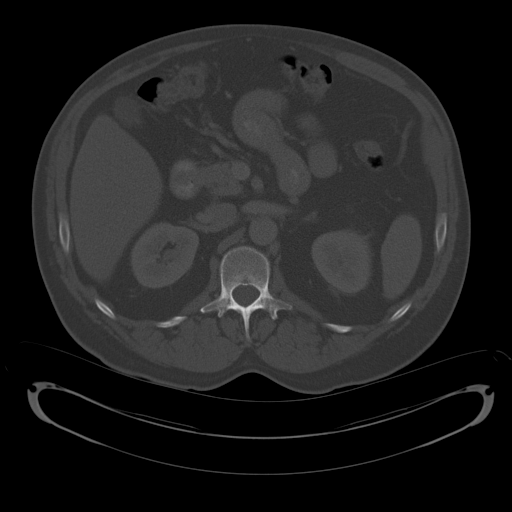
[im 67/95  soft-tissue]
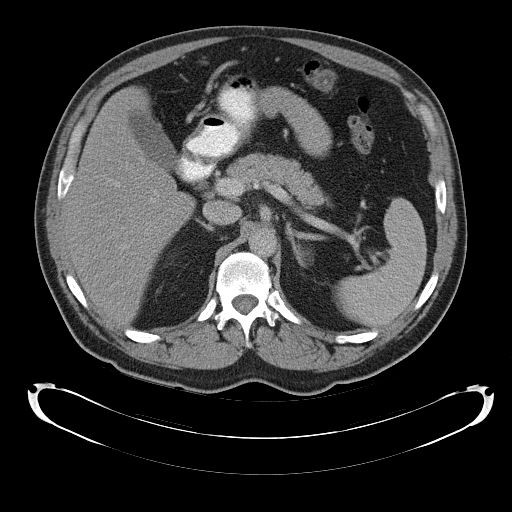
[im 72/95  soft-tissue]
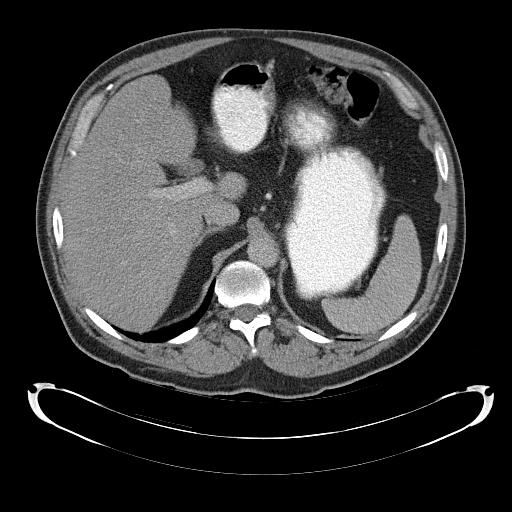
[im 83/95  soft-tissue]
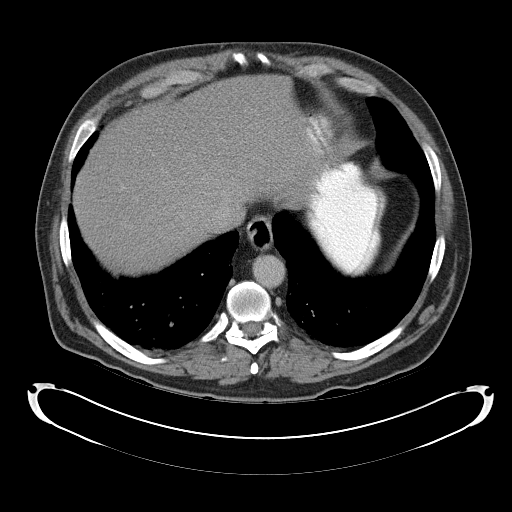
[im 89/95  soft-tissue]
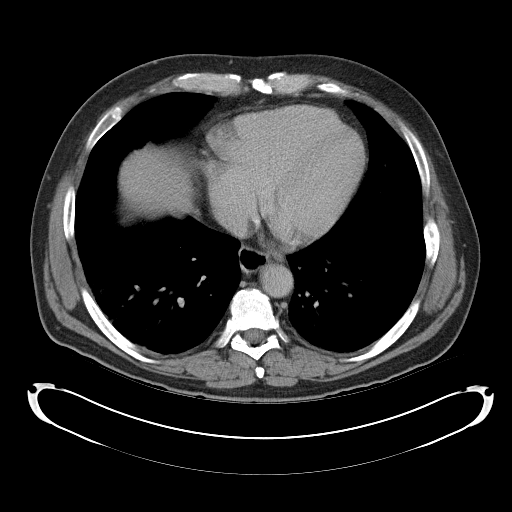

[Series 4: abd_pel_with 3.0 spo cor · coronal · 0.74mm/px · 3 of 99 slices shown]
[im 33/99  soft-tissue]
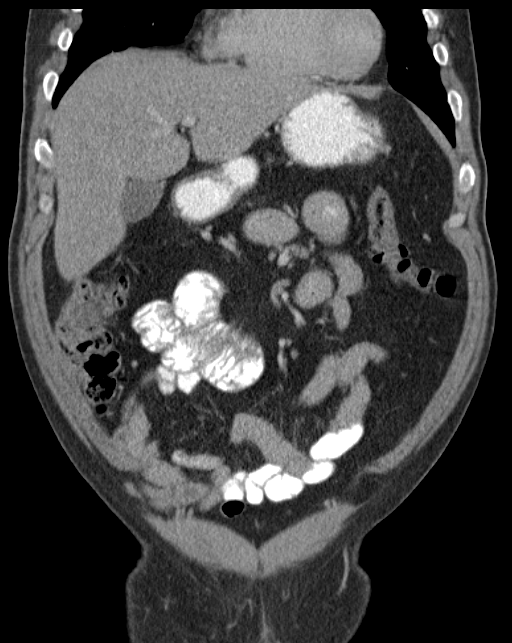
[im 44/99  soft-tissue]
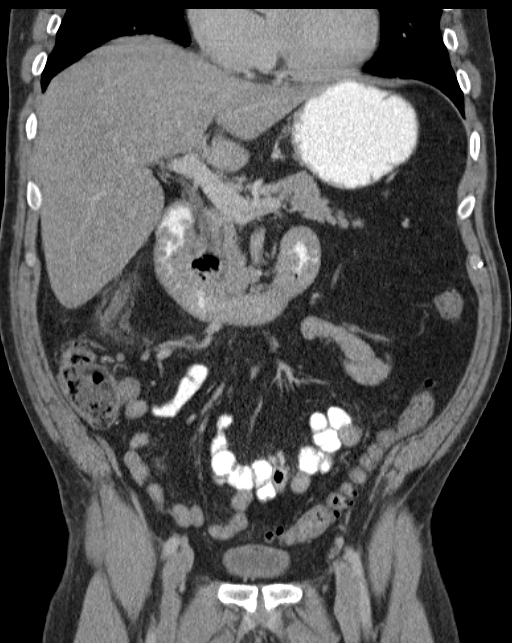
[im 55/99  soft-tissue]
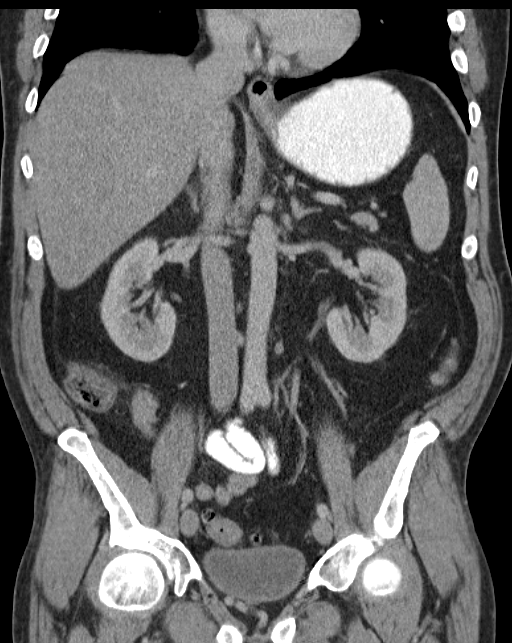

[16 of 46 positions shown; findings below may reference images not displayed]

FINDINGS: Bibasilar atelectasis, worse in the right lower lobe. Normal heart
size. No pericardial or pleural effusion. Nonspecific distention of
the distal esophagus. No significant hiatal hernia.

Abdomen: Liver, gallbladder, biliary system, pancreas, spleen,
adrenal glands, and kidneys are within normal limits for age and
demonstrate no acute process.

Slight prominence of proximal small bowel involving the duodenum and
jejunum, nonspecific. Negative for obstruction, dilatation, ileus,
or free air. Scattered colonic diverticulosis.

Appendix demonstrates diffuse surrounding inflammation/ edema and
distension. Appendix measures 14 mm in diameter, image 52. Findings
compatible with acute appendicitis, nonruptured. Appendix is
retrocecal in position.

Scattered colonic diverticulosis diffusely.

No abdominal fluid collection, hemorrhage, abscess, or adenopathy.

Negative for aneurysm.

Pelvis: Iliac calcific atherosclerosis. Sigmoid diverticulosis noted
without acute inflammation. Urinary bladder unremarkable. No pelvic
free fluid, fluid collection, hemorrhage, abscess, adenopathy,
inguinal abnormality, or hernia.

Diffuse degenerative changes noted of the spine. Bilateral L5 pars
defects evident with L5 on S1 anterior slippage measuring 14 mm and
associated advanced degenerative disc disease. No other acute
osseous finding.
IMPRESSION: Acute nonruptured appendicitis, retrocecal in position.

Colonic diverticulosis

Bilateral L5 pars defects with associated degenerative disc disease
and anterolisthesis at this level

These results were called by telephone at the time of interpretation
on 02/28/2013 at [DATE] to Dr. NOLA OXENDINE , who verbally
acknowledged these results.

## 2015-02-06 IMAGING — CR DG CHEST 2V
2 series · 2 of 2 positions shown · non-contrast
Comparison: None.

CLINICAL DATA: Appendicitis, preoperative evaluation

EXAM:
CHEST  2 VIEW

[view not recorded (1 of 2)]
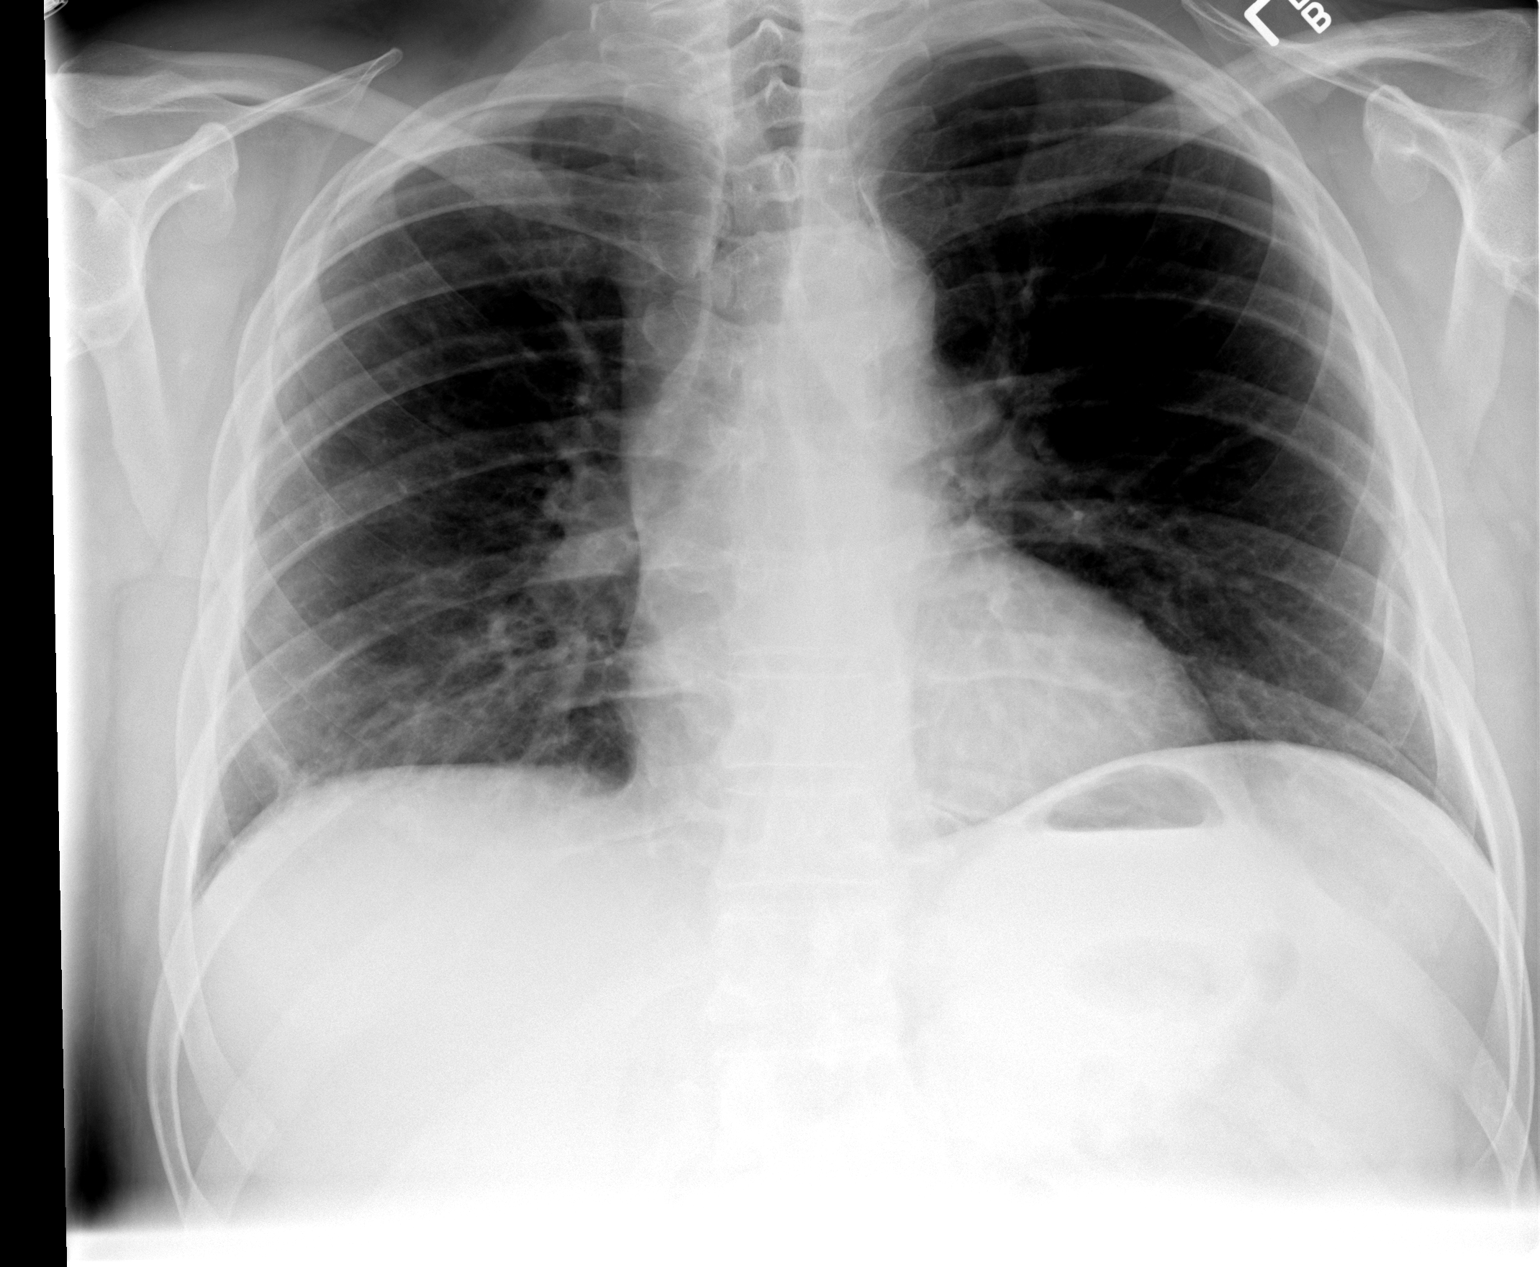

[view not recorded (2 of 2)]
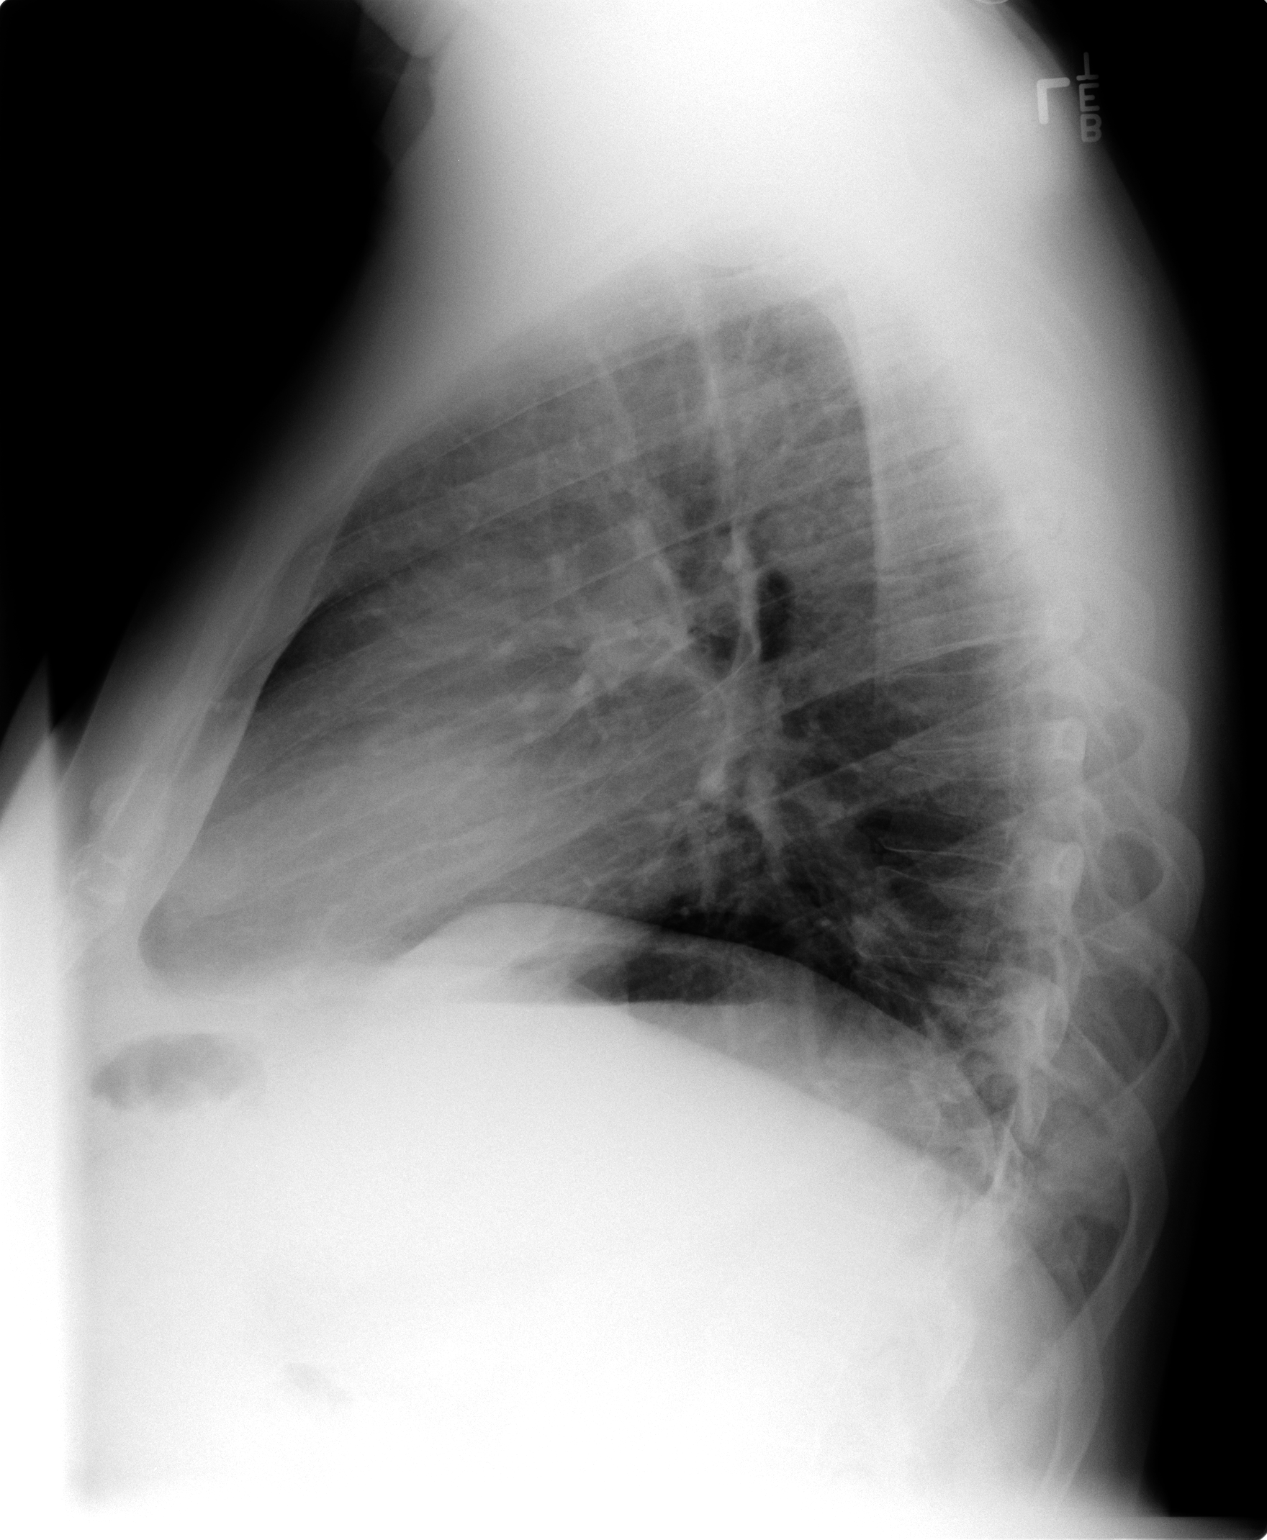

[2 of 2 positions shown; findings below may reference images not displayed]

FINDINGS: Low volume chest exam with basilar atelectasis, worse on the right.
No effusion or pneumothorax. Normal heart size and vascularity.
Trachea midline.
IMPRESSION: Bibasilar atelectasis and low lung volumes.

## 2015-12-21 DIAGNOSIS — J209 Acute bronchitis, unspecified: Secondary | ICD-10-CM | POA: Diagnosis not present

## 2015-12-21 DIAGNOSIS — J111 Influenza due to unidentified influenza virus with other respiratory manifestations: Secondary | ICD-10-CM | POA: Diagnosis not present

## 2015-12-21 DIAGNOSIS — H6691 Otitis media, unspecified, right ear: Secondary | ICD-10-CM | POA: Diagnosis not present

## 2016-03-28 DIAGNOSIS — Z6829 Body mass index (BMI) 29.0-29.9, adult: Secondary | ICD-10-CM | POA: Diagnosis not present

## 2016-03-28 DIAGNOSIS — I10 Essential (primary) hypertension: Secondary | ICD-10-CM | POA: Diagnosis not present

## 2016-03-28 DIAGNOSIS — E663 Overweight: Secondary | ICD-10-CM | POA: Diagnosis not present

## 2016-03-28 DIAGNOSIS — Z1389 Encounter for screening for other disorder: Secondary | ICD-10-CM | POA: Diagnosis not present

## 2016-07-11 DIAGNOSIS — S0592XA Unspecified injury of left eye and orbit, initial encounter: Secondary | ICD-10-CM | POA: Diagnosis not present

## 2016-07-11 DIAGNOSIS — H1132 Conjunctival hemorrhage, left eye: Secondary | ICD-10-CM | POA: Diagnosis not present

## 2016-08-15 DIAGNOSIS — M5412 Radiculopathy, cervical region: Secondary | ICD-10-CM | POA: Diagnosis not present

## 2016-08-31 DIAGNOSIS — M7552 Bursitis of left shoulder: Secondary | ICD-10-CM | POA: Diagnosis not present

## 2016-08-31 DIAGNOSIS — I10 Essential (primary) hypertension: Secondary | ICD-10-CM | POA: Diagnosis not present

## 2016-09-03 ENCOUNTER — Encounter (HOSPITAL_COMMUNITY): Payer: Self-pay | Admitting: Cardiology

## 2016-09-03 ENCOUNTER — Emergency Department (HOSPITAL_COMMUNITY)
Admission: EM | Admit: 2016-09-03 | Discharge: 2016-09-03 | Disposition: A | Discharge status: Home / Self Care | Payer: BLUE CROSS/BLUE SHIELD | Attending: Emergency Medicine | Admitting: Emergency Medicine

## 2016-09-03 DIAGNOSIS — F17200 Nicotine dependence, unspecified, uncomplicated: Secondary | ICD-10-CM | POA: Diagnosis not present

## 2016-09-03 DIAGNOSIS — M542 Cervicalgia: Secondary | ICD-10-CM | POA: Diagnosis present

## 2016-09-03 DIAGNOSIS — Z79899 Other long term (current) drug therapy: Secondary | ICD-10-CM | POA: Diagnosis not present

## 2016-09-03 DIAGNOSIS — M5412 Radiculopathy, cervical region: Secondary | ICD-10-CM | POA: Diagnosis not present

## 2016-09-03 DIAGNOSIS — I10 Essential (primary) hypertension: Secondary | ICD-10-CM | POA: Diagnosis not present

## 2016-09-03 MED ORDER — PREDNISONE 10 MG PO TABS: ORAL_TABLET | ORAL | 0 refills | Status: DC

## 2016-09-03 MED ORDER — OXYCODONE-ACETAMINOPHEN 5-325 MG PO TABS: 1.0000 | ORAL_TABLET | ORAL | 0 refills | Status: DC | PRN

## 2016-09-03 MED ORDER — OXYCODONE-ACETAMINOPHEN 5-325 MG PO TABS
2.0000 | ORAL_TABLET | Freq: Once | ORAL | Status: AC
  Administered 2016-09-03: 2 via ORAL
  Filled 2016-09-03: qty 2

## 2016-09-03 NOTE — ED Triage Notes (Addendum)
Left shoulder, neck and left arm pain times one month. Worse with movement.   Denies injury.

## 2016-09-03 NOTE — ED Provider Notes (Signed)
[facility]-EMER[facility]ENCY [facility]PT Provider Note   CSN: [id number] Arrival date & time: 09/03/16  1749     History   Chief Complaint Chief Complaint  Patient presents with  . Shoulder Pain    HPI Eddie [name] is a 56 y.o. male.  HPI  Eddie [name] is a 56 y.o. male who presents to the Emergency Department complaining of persistent left shoulder and neck pain.  He describes a burning, shooting pain from the left neck that radiates across the top of the shoulder and into the back of his upper arm.  Pain is worse with certain movements and improves at rest.  Symptoms began one month ago and have been worse for 1-2 weeks.  He denies known injury.  He was seen at an urgent care at onset.  He was treated then with steroids which he states helped for a brief time.  He denies fever, headache, visual changes, chest pain or jaw pain, shortness of breath, numbness or weakness of the arm.  OTC pain relievers are not helping control his symptoms.    Past Medical History:  Diagnosis Date  . Diverticulitis   . Hypertension     Patient Active Problem List   Diagnosis Date Noted  . Acute appendicitis 03/01/2013  . Appendicitis 02/28/2013    Past Surgical History:  Procedure Laterality Date  . HERNIA REPAIR    . LAPAROSCOPIC APPENDECTOMY N/A 03/01/2013   Procedure: APPENDECTOMY LAPAROSCOPIC;  Surgeon: Dalia HeadingMark A Jenkins, MD;  Location: [facility];  Service: General;  Laterality: N/A;       Home Medications    Prior to Admission medications   Medication Sig Start Date End Date Taking? Authorizing Provider  hydrochlorothiazide (HYDRODIURIL) 12.5 MG tablet Take 12.5 mg by mouth daily.    [provider]  hydrOXYzine (ATARAX/VISTARIL) 25 MG tablet Take 25 mg by mouth every 8 (eight) hours as needed.    [provider]  oxyCODONE-acetaminophen (PERCOCET/ROXICET) 5-325 MG tablet Take 1 tablet by mouth every 4 (four) hours as needed. 09/03/16   Sache Sane, PA-C  predniSONE (DELTASONE) 10  MG tablet Take 6 tablets day one, 5 tablets day two, 4 tablets day three, 3 tablets day four, 2 tablets day five, then 1 tablet day six 09/03/16   Pauline Ausriplett, Shawnay Bramel, PA-C    Family History History reviewed. No pertinent family history.  Social History Social History  Substance Use Topics  . Smoking status: Current Every Day Smoker    Packs/day: 1.00  . Smokeless tobacco: Not on file  . Alcohol use Yes     Comment: occasional     Allergies   Patient has no known allergies.   Review of Systems Review of Systems  Constitutional: Negative for appetite change and fever.  Eyes: Negative for visual disturbance.  Respiratory: Negative for chest tightness and shortness of breath.   Cardiovascular: Negative for chest pain.  Gastrointestinal: Negative for abdominal pain, nausea and vomiting.  Genitourinary: Negative for decreased urine volume, difficulty urinating, dysuria, flank pain and hematuria.  Musculoskeletal: Positive for arthralgias and neck pain. Negative for back pain, joint swelling and neck stiffness.  Skin: Negative for rash.  Neurological: Negative for dizziness, syncope, speech difficulty, weakness, numbness and headaches.  Psychiatric/Behavioral: Negative for confusion.  All other systems reviewed and are negative.    Physical Exam Updated Vital Signs BP 124/83 (BP Location: Right Arm)   Pulse 76   Temp 98.4 F (36.9 C) (Oral)   Resp 18   Ht 6' (1.829  m)   Wt 99.8 kg (220 lb)   SpO2 97%   BMI 29.84 kg/m   Physical Exam  Constitutional: He is oriented to person, place, and time. He appears well-developed and well-nourished. No distress.  HENT:  Head: Normocephalic and atraumatic.  Mouth/Throat: Oropharynx is clear and moist.  Cardiovascular: Normal rate, regular rhythm and intact distal pulses.   No murmur heard. Pulmonary/Chest: Effort normal and breath sounds normal. No respiratory distress.  Abdominal: Soft. He exhibits no distension. There is no  tenderness.  Musculoskeletal: He exhibits tenderness. He exhibits no edema.       Lumbar back: He exhibits tenderness and pain. He exhibits normal range of motion, no swelling, no deformity, no laceration and normal pulse.  ttp of the left cervical paraspinal muscles.  Pt has 5/5 strength against resistance of bilateral upper extremities.     Neurological: He is alert and oriented to person, place, and time. He has normal strength. No sensory deficit. He exhibits normal muscle tone. Coordination and gait normal. GCS eye subscore is 4. GCS verbal subscore is 5. GCS motor subscore is 6.  Reflex Scores:      Tricep reflexes are 2+ on the right side and 2+ on the left side.      Bicep reflexes are 2+ on the right side and 2+ on the left side. CN II-XII intact  Skin: Skin is warm and dry. Capillary refill takes less than 2 seconds. No rash noted.  Psychiatric: He has a normal mood and affect.  Nursing note and vitals reviewed.    ED Treatments / Results  Labs (all labs ordered are listed, but only abnormal results are displayed) Labs Reviewed - No data to display  EKG  EKG Interpretation None       Radiology No results found.  Procedures Procedures (including critical care time)  Medications Ordered in ED Medications  oxyCODONE-acetaminophen (PERCOCET/ROXICET) 5-325 MG per tablet 2 tablet (2 tablets Oral Given 09/03/16 1831)     Initial Impression / Assessment and Plan / ED Course  I have reviewed the triage vital signs and the nursing notes.  Pertinent labs & imaging results that were available during my care of the patient were reviewed by me and considered in my medical decision making (see chart for details).     Pt reviewed on the [place] narcotic database.  No recent prescriptions filed.   Pt with likely acute on chronic radicular pain.  No focal neuro deficits, no motor weakness. Appears stable for d/c.  Referral info given.    Final Clinical Impressions(s) / ED  Diagnoses   Final diagnoses:  Cervical radicular pain    New Prescriptions New Prescriptions   OXYCODONE-ACETAMINOPHEN (PERCOCET/ROXICET) 5-325 MG TABLET    Take 1 tablet by mouth every 4 (four) hours as needed.   PREDNISONE (DELTASONE) 10 MG TABLET    Take 6 tablets day one, 5 tablets day two, 4 tablets day three, 3 tablets day four, 2 tablets day five, then 1 tablet day six     Pauline Ausriplett, Nickholas Goldston, PA-C 09/03/16 2028    Vanetta MuldersZackowski, Scott, MD 09/05/16 [phone]

## 2016-09-03 NOTE — Discharge Instructions (Signed)
Apply ice packs on/off to your neck.  Call your primary provider or Dr. Mort SawyersHarrison's office to arrange a follow-up appt.

## 2016-09-12 ENCOUNTER — Telehealth: Payer: Self-pay | Admitting: Orthopaedic Surgery

## 2016-09-12 NOTE — Telephone Encounter (Signed)
Patient called and said he went to ER for neck pain.  I read in the notes that he had also been to Urgent Care and had x-rays done.  I told the patient that he would have to get those notes from Urgent Care along with the xrays and the xray report. I asked him to bring those by the office and at that time, we would see about scheduling him an appointment.  He said he would do this.

## 2016-09-13 DIAGNOSIS — Z6828 Body mass index (BMI) 28.0-28.9, adult: Secondary | ICD-10-CM | POA: Diagnosis not present

## 2016-09-13 DIAGNOSIS — E663 Overweight: Secondary | ICD-10-CM | POA: Diagnosis not present

## 2016-09-13 DIAGNOSIS — Z1389 Encounter for screening for other disorder: Secondary | ICD-10-CM | POA: Diagnosis not present

## 2016-09-13 DIAGNOSIS — M79622 Pain in left upper arm: Secondary | ICD-10-CM | POA: Diagnosis not present

## 2016-09-13 DIAGNOSIS — M5412 Radiculopathy, cervical region: Secondary | ICD-10-CM | POA: Diagnosis not present

## 2016-09-15 ENCOUNTER — Telehealth: Payer: Self-pay | Admitting: Orthopedic Surgery

## 2016-09-15 NOTE — Telephone Encounter (Signed)
Patient brought his notes by for Dr. Romeo AppleHarrison to review.  Dr. Romeo AppleHarrison advised that this gentleman should call [name] for an appointment.  I called and told Eddie [name] what Dr. Romeo AppleHarrison said.  I will hold his Urgent Care Notes and his xrays for him to have.  He will call his PCP and let them know what is going on.  He will see if they can refer him to  [name].

## 2016-09-20 DIAGNOSIS — Z1389 Encounter for screening for other disorder: Secondary | ICD-10-CM | POA: Diagnosis not present

## 2016-09-20 DIAGNOSIS — Z6829 Body mass index (BMI) 29.0-29.9, adult: Secondary | ICD-10-CM | POA: Diagnosis not present

## 2016-09-20 DIAGNOSIS — M5412 Radiculopathy, cervical region: Secondary | ICD-10-CM | POA: Diagnosis not present

## 2017-08-28 DIAGNOSIS — I10 Essential (primary) hypertension: Secondary | ICD-10-CM | POA: Diagnosis not present

## 2017-08-28 DIAGNOSIS — Z6828 Body mass index (BMI) 28.0-28.9, adult: Secondary | ICD-10-CM | POA: Diagnosis not present

## 2017-08-28 DIAGNOSIS — M5412 Radiculopathy, cervical region: Secondary | ICD-10-CM | POA: Diagnosis not present

## 2017-08-28 DIAGNOSIS — E663 Overweight: Secondary | ICD-10-CM | POA: Diagnosis not present

## 2017-08-28 DIAGNOSIS — Z1389 Encounter for screening for other disorder: Secondary | ICD-10-CM | POA: Diagnosis not present

## 2017-10-16 DIAGNOSIS — M5412 Radiculopathy, cervical region: Secondary | ICD-10-CM | POA: Diagnosis not present

## 2018-03-26 DIAGNOSIS — L509 Urticaria, unspecified: Secondary | ICD-10-CM | POA: Diagnosis not present

## 2018-03-26 DIAGNOSIS — L853 Xerosis cutis: Secondary | ICD-10-CM | POA: Diagnosis not present

## 2018-03-26 DIAGNOSIS — L309 Dermatitis, unspecified: Secondary | ICD-10-CM | POA: Diagnosis not present

## 2018-03-26 DIAGNOSIS — T783XXA Angioneurotic edema, initial encounter: Secondary | ICD-10-CM | POA: Diagnosis not present

## 2018-05-21 DIAGNOSIS — Z681 Body mass index (BMI) 19 or less, adult: Secondary | ICD-10-CM | POA: Diagnosis not present

## 2018-05-21 DIAGNOSIS — Z1389 Encounter for screening for other disorder: Secondary | ICD-10-CM | POA: Diagnosis not present

## 2018-05-21 DIAGNOSIS — I10 Essential (primary) hypertension: Secondary | ICD-10-CM | POA: Diagnosis not present

## 2018-05-21 DIAGNOSIS — E7849 Other hyperlipidemia: Secondary | ICD-10-CM | POA: Diagnosis not present

## 2018-05-21 DIAGNOSIS — Z719 Counseling, unspecified: Secondary | ICD-10-CM | POA: Diagnosis not present

## 2018-08-12 DIAGNOSIS — Z1211 Encounter for screening for malignant neoplasm of colon: Secondary | ICD-10-CM | POA: Diagnosis not present

## 2018-11-04 DIAGNOSIS — R42 Dizziness and giddiness: Secondary | ICD-10-CM | POA: Diagnosis not present

## 2018-11-04 DIAGNOSIS — I10 Essential (primary) hypertension: Secondary | ICD-10-CM | POA: Diagnosis not present

## 2018-11-04 DIAGNOSIS — G4489 Other headache syndrome: Secondary | ICD-10-CM | POA: Diagnosis not present

## 2018-11-04 DIAGNOSIS — R0902 Hypoxemia: Secondary | ICD-10-CM | POA: Diagnosis not present

## 2019-05-06 DIAGNOSIS — Z719 Counseling, unspecified: Secondary | ICD-10-CM | POA: Diagnosis not present

## 2019-05-06 DIAGNOSIS — I10 Essential (primary) hypertension: Secondary | ICD-10-CM | POA: Diagnosis not present

## 2019-05-06 DIAGNOSIS — E7849 Other hyperlipidemia: Secondary | ICD-10-CM | POA: Diagnosis not present

## 2019-05-06 DIAGNOSIS — E663 Overweight: Secondary | ICD-10-CM | POA: Diagnosis not present

## 2019-05-06 DIAGNOSIS — Z1389 Encounter for screening for other disorder: Secondary | ICD-10-CM | POA: Diagnosis not present

## 2019-05-06 DIAGNOSIS — R972 Elevated prostate specific antigen [PSA]: Secondary | ICD-10-CM | POA: Diagnosis not present

## 2019-05-06 DIAGNOSIS — Z6829 Body mass index (BMI) 29.0-29.9, adult: Secondary | ICD-10-CM | POA: Diagnosis not present

## 2020-02-08 DIAGNOSIS — U071 COVID-19: Secondary | ICD-10-CM | POA: Diagnosis not present

## 2020-04-21 DIAGNOSIS — J111 Influenza due to unidentified influenza virus with other respiratory manifestations: Secondary | ICD-10-CM | POA: Diagnosis not present

## 2020-04-21 DIAGNOSIS — H01005 Unspecified blepharitis left lower eyelid: Secondary | ICD-10-CM | POA: Diagnosis not present

## 2020-05-18 DIAGNOSIS — I10 Essential (primary) hypertension: Secondary | ICD-10-CM | POA: Diagnosis not present

## 2020-05-18 DIAGNOSIS — Z1389 Encounter for screening for other disorder: Secondary | ICD-10-CM | POA: Diagnosis not present

## 2020-05-18 DIAGNOSIS — Z6831 Body mass index (BMI) 31.0-31.9, adult: Secondary | ICD-10-CM | POA: Diagnosis not present

## 2020-05-18 DIAGNOSIS — E7849 Other hyperlipidemia: Secondary | ICD-10-CM | POA: Diagnosis not present

## 2020-05-18 DIAGNOSIS — Z0000 Encounter for general adult medical examination without abnormal findings: Secondary | ICD-10-CM | POA: Diagnosis not present

## 2020-05-18 DIAGNOSIS — Z1331 Encounter for screening for depression: Secondary | ICD-10-CM | POA: Diagnosis not present

## 2020-09-28 ENCOUNTER — Ambulatory Visit (HOSPITAL_COMMUNITY)
Admission: RE | Admit: 2020-09-28 | Discharge: 2020-09-28 | Disposition: A | Discharge status: Home / Self Care | Payer: BC Managed Care – PPO | Source: Ambulatory Visit | Attending: Family Medicine | Admitting: Family Medicine

## 2020-09-28 ENCOUNTER — Other Ambulatory Visit: Payer: Self-pay

## 2020-09-28 ENCOUNTER — Other Ambulatory Visit (HOSPITAL_COMMUNITY): Payer: Self-pay | Admitting: Family Medicine

## 2020-09-28 DIAGNOSIS — M546 Pain in thoracic spine: Secondary | ICD-10-CM | POA: Insufficient documentation

## 2020-09-28 DIAGNOSIS — E663 Overweight: Secondary | ICD-10-CM | POA: Diagnosis not present

## 2020-09-28 DIAGNOSIS — Z6829 Body mass index (BMI) 29.0-29.9, adult: Secondary | ICD-10-CM | POA: Diagnosis not present

## 2020-09-28 DIAGNOSIS — Z719 Counseling, unspecified: Secondary | ICD-10-CM | POA: Diagnosis not present

## 2021-07-12 DIAGNOSIS — I10 Essential (primary) hypertension: Secondary | ICD-10-CM | POA: Diagnosis not present

## 2021-07-12 DIAGNOSIS — E782 Mixed hyperlipidemia: Secondary | ICD-10-CM | POA: Diagnosis not present

## 2021-07-12 DIAGNOSIS — E6609 Other obesity due to excess calories: Secondary | ICD-10-CM | POA: Diagnosis not present

## 2021-07-12 DIAGNOSIS — Z6830 Body mass index (BMI) 30.0-30.9, adult: Secondary | ICD-10-CM | POA: Diagnosis not present

## 2022-03-11 DIAGNOSIS — Z0001 Encounter for general adult medical examination with abnormal findings: Secondary | ICD-10-CM | POA: Diagnosis not present

## 2022-03-11 DIAGNOSIS — E782 Mixed hyperlipidemia: Secondary | ICD-10-CM | POA: Diagnosis not present

## 2022-03-11 DIAGNOSIS — Z1331 Encounter for screening for depression: Secondary | ICD-10-CM | POA: Diagnosis not present

## 2022-03-11 DIAGNOSIS — Z6831 Body mass index (BMI) 31.0-31.9, adult: Secondary | ICD-10-CM | POA: Diagnosis not present

## 2022-03-11 DIAGNOSIS — E6609 Other obesity due to excess calories: Secondary | ICD-10-CM | POA: Diagnosis not present

## 2022-03-11 DIAGNOSIS — I10 Essential (primary) hypertension: Secondary | ICD-10-CM | POA: Diagnosis not present

## 2022-03-11 DIAGNOSIS — Z125 Encounter for screening for malignant neoplasm of prostate: Secondary | ICD-10-CM | POA: Diagnosis not present

## 2022-07-22 DIAGNOSIS — Z6831 Body mass index (BMI) 31.0-31.9, adult: Secondary | ICD-10-CM | POA: Diagnosis not present

## 2022-07-22 DIAGNOSIS — E6609 Other obesity due to excess calories: Secondary | ICD-10-CM | POA: Diagnosis not present

## 2022-07-22 DIAGNOSIS — I10 Essential (primary) hypertension: Secondary | ICD-10-CM | POA: Diagnosis not present

## 2022-07-22 DIAGNOSIS — R109 Unspecified abdominal pain: Secondary | ICD-10-CM | POA: Diagnosis not present

## 2023-05-05 DIAGNOSIS — Z125 Encounter for screening for malignant neoplasm of prostate: Secondary | ICD-10-CM | POA: Diagnosis not present

## 2023-05-05 DIAGNOSIS — Z6829 Body mass index (BMI) 29.0-29.9, adult: Secondary | ICD-10-CM | POA: Diagnosis not present

## 2023-05-05 DIAGNOSIS — Z1331 Encounter for screening for depression: Secondary | ICD-10-CM | POA: Diagnosis not present

## 2023-05-05 DIAGNOSIS — R109 Unspecified abdominal pain: Secondary | ICD-10-CM | POA: Diagnosis not present

## 2023-05-05 DIAGNOSIS — E663 Overweight: Secondary | ICD-10-CM | POA: Diagnosis not present

## 2023-05-05 DIAGNOSIS — Z0001 Encounter for general adult medical examination with abnormal findings: Secondary | ICD-10-CM | POA: Diagnosis not present

## 2023-05-05 DIAGNOSIS — E782 Mixed hyperlipidemia: Secondary | ICD-10-CM | POA: Diagnosis not present

## 2023-05-05 DIAGNOSIS — E7849 Other hyperlipidemia: Secondary | ICD-10-CM | POA: Diagnosis not present

## 2023-05-05 DIAGNOSIS — I10 Essential (primary) hypertension: Secondary | ICD-10-CM | POA: Diagnosis not present

## 2023-07-06 ENCOUNTER — Emergency Department (HOSPITAL_COMMUNITY)
Admission: EM | Admit: 2023-07-06 | Discharge: 2023-07-07 | Disposition: A | Discharge status: Home / Self Care | Attending: Emergency Medicine | Admitting: Emergency Medicine

## 2023-07-06 ENCOUNTER — Encounter (HOSPITAL_COMMUNITY): Payer: Self-pay

## 2023-07-06 ENCOUNTER — Other Ambulatory Visit: Payer: Self-pay

## 2023-07-06 DIAGNOSIS — R1031 Right lower quadrant pain: Secondary | ICD-10-CM | POA: Insufficient documentation

## 2023-07-06 DIAGNOSIS — E871 Hypo-osmolality and hyponatremia: Secondary | ICD-10-CM | POA: Insufficient documentation

## 2023-07-06 DIAGNOSIS — R001 Bradycardia, unspecified: Secondary | ICD-10-CM | POA: Diagnosis not present

## 2023-07-06 DIAGNOSIS — D72829 Elevated white blood cell count, unspecified: Secondary | ICD-10-CM | POA: Insufficient documentation

## 2023-07-06 LAB — URINALYSIS, ROUTINE W REFLEX MICROSCOPIC
Bacteria, UA: NONE SEEN
Bilirubin Urine: NEGATIVE
Glucose, UA: NEGATIVE mg/dL
Hgb urine dipstick: NEGATIVE
Ketones, ur: NEGATIVE mg/dL
Leukocytes,Ua: NEGATIVE
Nitrite: NEGATIVE
Protein, ur: 100 mg/dL — AB
Specific Gravity, Urine: 1.021 (ref 1.005–1.030)
pH: 5 (ref 5.0–8.0)

## 2023-07-06 NOTE — ED Triage Notes (Signed)
 Pt reports:  Abdomen pain Started 2:30am Worsening throughout the day Back pain Started 2:30am Worsening throughout the day

## 2023-07-07 ENCOUNTER — Emergency Department (HOSPITAL_COMMUNITY)

## 2023-07-07 DIAGNOSIS — R109 Unspecified abdominal pain: Secondary | ICD-10-CM | POA: Diagnosis not present

## 2023-07-07 DIAGNOSIS — K5750 Diverticulosis of both small and large intestine without perforation or abscess without bleeding: Secondary | ICD-10-CM | POA: Diagnosis not present

## 2023-07-07 DIAGNOSIS — K760 Fatty (change of) liver, not elsewhere classified: Secondary | ICD-10-CM | POA: Diagnosis not present

## 2023-07-07 LAB — CBC
HCT: 48.6 % (ref 39.0–52.0)
Hemoglobin: 17.3 g/dL — ABNORMAL HIGH (ref 13.0–17.0)
MCH: 30.9 pg (ref 26.0–34.0)
MCHC: 35.6 g/dL (ref 30.0–36.0)
MCV: 86.8 fL (ref 80.0–100.0)
Platelets: 154 10*3/uL (ref 150–400)
RBC: 5.6 MIL/uL (ref 4.22–5.81)
RDW: 13.4 % (ref 11.5–15.5)
WBC: 11 10*3/uL — ABNORMAL HIGH (ref 4.0–10.5)
nRBC: 0 % (ref 0.0–0.2)

## 2023-07-07 LAB — COMPREHENSIVE METABOLIC PANEL WITH GFR
ALT: 20 U/L (ref 0–44)
AST: 18 U/L (ref 15–41)
Albumin: 3.6 g/dL (ref 3.5–5.0)
Alkaline Phosphatase: 57 U/L (ref 38–126)
Anion gap: 11 (ref 5–15)
BUN: 13 mg/dL (ref 8–23)
CO2: 22 mmol/L (ref 22–32)
Calcium: 8.7 mg/dL — ABNORMAL LOW (ref 8.9–10.3)
Chloride: 96 mmol/L — ABNORMAL LOW (ref 98–111)
Creatinine, Ser: 0.81 mg/dL (ref 0.61–1.24)
GFR, Estimated: >60 mL/min (ref >60)
Glucose, Bld: 85 mg/dL (ref 70–99)
Potassium: 3.9 mmol/L (ref 3.5–5.1)
Sodium: 129 mmol/L — ABNORMAL LOW (ref 135–145)
Total Bilirubin: 1 mg/dL (ref 0.0–1.2)
Total Protein: 6.8 g/dL (ref 6.5–8.1)

## 2023-07-07 LAB — LIPASE, BLOOD: Lipase: 49 U/L (ref 11–51)

## 2023-07-07 MED ORDER — MORPHINE SULFATE (PF) 4 MG/ML IV SOLN
6.0000 mg | Freq: Once | INTRAVENOUS | Status: AC
  Administered 2023-07-07: 6 mg via INTRAVENOUS
  Filled 2023-07-07: qty 2

## 2023-07-07 MED ORDER — IOHEXOL 300 MG/ML  SOLN
100.0000 mL | Freq: Once | INTRAMUSCULAR | Status: AC | PRN
  Administered 2023-07-07: 100 mL via INTRAVENOUS

## 2023-07-07 MED ORDER — HYDROMORPHONE HCL 1 MG/ML IJ SOLN
1.0000 mg | Freq: Once | INTRAMUSCULAR | Status: AC
  Administered 2023-07-07: 1 mg via INTRAVENOUS
  Filled 2023-07-07: qty 1

## 2023-07-07 NOTE — ED Notes (Signed)
 ED Provider at bedside.

## 2023-07-07 NOTE — ED Notes (Signed)
 Patient transported to CT

## 2023-07-07 NOTE — ED Provider Notes (Signed)
 [place] [facility] AT [facility] Provider Note   CSN: [id number] Arrival date & time: 07/06/23  2203     History  Chief Complaint  Patient presents with   Abdominal Pain   Back Pain    Eddie [name] is a 63 y.o. male.  63 yo m here with lower back pain radiating towards his rlq. Started early in the morning Thursday, progressed through the day and around 2100 got worse so he came in for evaluation. No fever. Last BM a few hours ago and relatively normal. No constipation or diarrhea. No nausea or vomiting. No urinary changes. H/o appendectomy. No sick contacts. No trauma or injuries. Not made worse with twisting. Small component of bloating sensation.    Abdominal Pain Back Pain Associated symptoms: abdominal pain        Home Medications Prior to Admission medications   Medication Sig Start Date End Date Taking? Authorizing Provider  hydrochlorothiazide  (HYDRODIURIL ) 12.5 MG tablet Take 12.5 mg by mouth daily.    [provider]  hydrOXYzine (ATARAX/VISTARIL) 25 MG tablet Take 25 mg by mouth every 8 (eight) hours as needed.    [provider]  oxyCODONE -acetaminophen  (PERCOCET/ROXICET) 5-325 MG tablet Take 1 tablet by mouth every 4 (four) hours as needed. 09/03/16   Triplett, Tammy, PA-C  predniSONE  (DELTASONE ) 10 MG tablet Take 6 tablets day one, 5 tablets day two, 4 tablets day three, 3 tablets day four, 2 tablets day five, then 1 tablet day six 09/03/16   Triplett, Tammy, PA-C      Allergies    Patient has no known allergies.    Review of Systems   Review of Systems  Gastrointestinal:  Positive for abdominal pain.  Musculoskeletal:  Positive for back pain.    Physical Exam Updated Vital Signs BP (!) 140/78   Pulse (!) 49   Temp 97.9 F (36.6 C) (Oral)   Resp 18   Ht 6' (1.829 m)   Wt 99.8 kg   SpO2 91%   BMI 29.84 kg/m  Physical Exam Vitals and nursing note reviewed.  Constitutional:      Appearance: He is  well-developed.  HENT:     Head: Normocephalic and atraumatic.  Cardiovascular:     Rate and Rhythm: Normal rate.  Pulmonary:     Effort: Pulmonary effort is normal. No respiratory distress.  Abdominal:     General: There is no distension.     Tenderness: There is no abdominal tenderness.     Hernia: No hernia is present.  Musculoskeletal:        General: Normal range of motion.     Cervical back: Normal range of motion.  Skin:    General: Skin is warm and dry.  Neurological:     General: No focal deficit present.     Mental Status: He is alert.     ED Results / Procedures / Treatments   Labs (all labs ordered are listed, but only abnormal results are displayed) Labs Reviewed  COMPREHENSIVE METABOLIC PANEL WITH GFR - Abnormal; Notable for the following components:      Result Value   Sodium 129 (*)    Chloride 96 (*)    Calcium 8.7 (*)    All other components within normal limits  CBC - Abnormal; Notable for the following components:   WBC 11.0 (*)    Hemoglobin 17.3 (*)    All other components within normal limits  URINALYSIS, ROUTINE W REFLEX MICROSCOPIC - Abnormal;  Notable for the following components:   Protein, ur 100 (*)    All other components within normal limits  LIPASE, BLOOD    EKG None  Radiology CT ABDOMEN PELVIS W CONTRAST Result Date: 07/07/2023 EXAM: CT ABDOMEN AND PELVIS WITH CONTRAST 07/07/2023 01:06:09 AM TECHNIQUE: CT of the abdomen and pelvis was performed with the administration of 100mL iohexol  (OMNIPAQUE ) 300 MG/ML solution. Multiplanar reformatted images are provided for review. Automated exposure control, iterative reconstruction, and/or weight based adjustment of the mA/kV was utilized to reduce the radiation dose to as low as reasonably achievable. COMPARISON: 02/28/2013 CLINICAL HISTORY: Abdominal pain, acute, nonlocalized. Abdomen pain-Started 2:30am-Worsening throughout the day; Back pain-Started 2:30am-Worsening throughout the day  FINDINGS: LOWER CHEST: Mild right basilar scarring. LIVER: Mild hepatic steatosis. GALLBLADDER AND BILE DUCTS: Gallbladder is unremarkable. No biliary ductal dilatation. SPLEEN: No acute abnormality. PANCREAS: No acute abnormality. ADRENAL GLANDS: No acute abnormality. KIDNEYS, URETERS AND BLADDER: No stones in the kidneys or ureters. No hydronephrosis. No perinephric or periureteral stranding. Urinary bladder is unremarkable. GI AND BOWEL: Small duodenal diverticulum. Sigmoid diverticulosis, without evidence of diverticulitis. Prior appendectomy. PERITONEUM AND RETROPERITONEUM: No ascites. No free air. VASCULATURE: Atherosclerotic calcifications of the abdominal aorta and branch vessels. LYMPH NODES: No lymphadenopathy. REPRODUCTIVE ORGANS: No acute abnormality. BONES AND SOFT TISSUES: Grade 2 spondylolisthesis at L5-S1. No acute osseous abnormality. No focal soft tissue abnormality. IMPRESSION: 1. No acute findings. Electronically signed by: Zadie Herter MD 07/07/2023 01:15 AM EDT RP Workstation: [id number]    Procedures Procedures    Medications Ordered in ED Medications  morphine (PF) 4 MG/ML injection 6 mg (6 mg Intravenous Given 07/07/23 0038)  iohexol  (OMNIPAQUE ) 300 MG/ML solution 100 mL (100 mLs Intravenous Contrast Given 07/07/23 0051)  HYDROmorphone  (DILAUDID ) injection 1 mg (1 mg Intravenous Given 07/07/23 0221)    ED Course/ Medical Decision Making/ A&P                                 Medical Decision Making Amount and/or Complexity of Data Reviewed Labs: ordered. Radiology: ordered.  Risk Prescription drug management.   Multiple possible etiologies of the patient's symptoms.  Consider possible gas, obstruction, abscess, colitis, atypical diverticulitis, gallbladder disease however his labs are reassuring.  His CT scan is normal.  Patient's pain improved with medications here.  Also consider more atypical causes such as nerve impingement, shingles, muscular causes.  No evidence  of those on exam at this time however if continues having symptoms will need to follow-up with his PCP.  If anything worsens or changes will return here.  Final Clinical Impression(s) / ED Diagnoses Final diagnoses:  Right lower quadrant abdominal pain    Rx / DC Orders ED Discharge Orders     None         Tex Conroy, Reymundo Caulk, MD 07/07/23 0302

## 2023-07-21 DIAGNOSIS — K6389 Other specified diseases of intestine: Secondary | ICD-10-CM | POA: Diagnosis not present

## 2023-07-21 DIAGNOSIS — K5730 Diverticulosis of large intestine without perforation or abscess without bleeding: Secondary | ICD-10-CM | POA: Diagnosis not present

## 2023-07-21 DIAGNOSIS — D125 Benign neoplasm of sigmoid colon: Secondary | ICD-10-CM | POA: Diagnosis not present

## 2023-07-21 DIAGNOSIS — K635 Polyp of colon: Secondary | ICD-10-CM | POA: Diagnosis not present

## 2023-07-21 DIAGNOSIS — D123 Benign neoplasm of transverse colon: Secondary | ICD-10-CM | POA: Diagnosis not present

## 2023-07-21 DIAGNOSIS — D127 Benign neoplasm of rectosigmoid junction: Secondary | ICD-10-CM | POA: Diagnosis not present

## 2023-07-21 DIAGNOSIS — Z1211 Encounter for screening for malignant neoplasm of colon: Secondary | ICD-10-CM | POA: Diagnosis not present

## 2023-07-21 DIAGNOSIS — D124 Benign neoplasm of descending colon: Secondary | ICD-10-CM | POA: Diagnosis not present

## 2024-01-01 DIAGNOSIS — I10 Essential (primary) hypertension: Secondary | ICD-10-CM | POA: Diagnosis not present

## 2024-01-01 DIAGNOSIS — K5730 Diverticulosis of large intestine without perforation or abscess without bleeding: Secondary | ICD-10-CM | POA: Diagnosis not present

## 2024-01-01 DIAGNOSIS — D72829 Elevated white blood cell count, unspecified: Secondary | ICD-10-CM | POA: Diagnosis not present

## 2024-01-01 DIAGNOSIS — E785 Hyperlipidemia, unspecified: Secondary | ICD-10-CM | POA: Diagnosis not present

## 2024-01-31 ENCOUNTER — Other Ambulatory Visit: Payer: Self-pay

## 2024-01-31 ENCOUNTER — Inpatient Hospital Stay (HOSPITAL_COMMUNITY)
Admission: EM | Admit: 2024-01-31 | Discharge: 2024-02-05 | DRG: 418 | Disposition: A | Discharge status: Home / Self Care | Attending: Family Medicine | Admitting: Family Medicine

## 2024-01-31 ENCOUNTER — Emergency Department (HOSPITAL_COMMUNITY)

## 2024-01-31 DIAGNOSIS — K760 Fatty (change of) liver, not elsewhere classified: Secondary | ICD-10-CM | POA: Diagnosis present

## 2024-01-31 DIAGNOSIS — E785 Hyperlipidemia, unspecified: Secondary | ICD-10-CM | POA: Diagnosis present

## 2024-01-31 DIAGNOSIS — K219 Gastro-esophageal reflux disease without esophagitis: Secondary | ICD-10-CM | POA: Diagnosis present

## 2024-01-31 DIAGNOSIS — R748 Abnormal levels of other serum enzymes: Secondary | ICD-10-CM | POA: Diagnosis present

## 2024-01-31 DIAGNOSIS — K5732 Diverticulitis of large intestine without perforation or abscess without bleeding: Secondary | ICD-10-CM | POA: Diagnosis present

## 2024-01-31 DIAGNOSIS — E861 Hypovolemia: Secondary | ICD-10-CM | POA: Diagnosis present

## 2024-01-31 DIAGNOSIS — K851 Biliary acute pancreatitis without necrosis or infection: Principal | ICD-10-CM | POA: Diagnosis present

## 2024-01-31 DIAGNOSIS — F17210 Nicotine dependence, cigarettes, uncomplicated: Secondary | ICD-10-CM | POA: Diagnosis present

## 2024-01-31 DIAGNOSIS — K838 Other specified diseases of biliary tract: Secondary | ICD-10-CM | POA: Diagnosis not present

## 2024-01-31 DIAGNOSIS — K769 Liver disease, unspecified: Secondary | ICD-10-CM | POA: Diagnosis not present

## 2024-01-31 DIAGNOSIS — K859 Acute pancreatitis without necrosis or infection, unspecified: Principal | ICD-10-CM | POA: Diagnosis present

## 2024-01-31 DIAGNOSIS — K8061 Calculus of gallbladder and bile duct with cholecystitis, unspecified, with obstruction: Secondary | ICD-10-CM | POA: Diagnosis present

## 2024-01-31 DIAGNOSIS — Z8249 Family history of ischemic heart disease and other diseases of the circulatory system: Secondary | ICD-10-CM

## 2024-01-31 DIAGNOSIS — G8929 Other chronic pain: Secondary | ICD-10-CM | POA: Diagnosis present

## 2024-01-31 DIAGNOSIS — N179 Acute kidney failure, unspecified: Secondary | ICD-10-CM | POA: Diagnosis present

## 2024-01-31 DIAGNOSIS — K2289 Other specified disease of esophagus: Secondary | ICD-10-CM | POA: Diagnosis present

## 2024-01-31 DIAGNOSIS — K828 Other specified diseases of gallbladder: Secondary | ICD-10-CM | POA: Diagnosis not present

## 2024-01-31 DIAGNOSIS — R680 Hypothermia, not associated with low environmental temperature: Secondary | ICD-10-CM | POA: Diagnosis present

## 2024-01-31 DIAGNOSIS — R6510 Systemic inflammatory response syndrome (SIRS) of non-infectious origin without acute organ dysfunction: Secondary | ICD-10-CM | POA: Diagnosis present

## 2024-01-31 DIAGNOSIS — I10 Essential (primary) hypertension: Secondary | ICD-10-CM | POA: Diagnosis present

## 2024-01-31 DIAGNOSIS — R7989 Other specified abnormal findings of blood chemistry: Secondary | ICD-10-CM

## 2024-01-31 DIAGNOSIS — R109 Unspecified abdominal pain: Secondary | ICD-10-CM | POA: Diagnosis not present

## 2024-01-31 DIAGNOSIS — F1010 Alcohol abuse, uncomplicated: Secondary | ICD-10-CM | POA: Diagnosis present

## 2024-01-31 DIAGNOSIS — E871 Hypo-osmolality and hyponatremia: Secondary | ICD-10-CM | POA: Diagnosis present

## 2024-01-31 DIAGNOSIS — K8050 Calculus of bile duct without cholangitis or cholecystitis without obstruction: Secondary | ICD-10-CM

## 2024-01-31 DIAGNOSIS — K2960 Other gastritis without bleeding: Secondary | ICD-10-CM | POA: Diagnosis present

## 2024-01-31 DIAGNOSIS — Z79899 Other long term (current) drug therapy: Secondary | ICD-10-CM

## 2024-01-31 DIAGNOSIS — K5790 Diverticulosis of intestine, part unspecified, without perforation or abscess without bleeding: Secondary | ICD-10-CM | POA: Diagnosis present

## 2024-01-31 LAB — URINALYSIS, ROUTINE W REFLEX MICROSCOPIC
Bacteria, UA: NONE SEEN
Glucose, UA: NEGATIVE mg/dL
Hgb urine dipstick: NEGATIVE
Ketones, ur: NEGATIVE mg/dL
Nitrite: NEGATIVE
Protein, ur: 30 mg/dL — AB
Specific Gravity, Urine: 1.024 (ref 1.005–1.030)
pH: 5 (ref 5.0–8.0)

## 2024-01-31 LAB — CBC
HCT: 45.3 % (ref 39.0–52.0)
Hemoglobin: 16.1 g/dL (ref 13.0–17.0)
MCH: 31 pg (ref 26.0–34.0)
MCHC: 35.5 g/dL (ref 30.0–36.0)
MCV: 87.1 fL (ref 80.0–100.0)
Platelets: 170 K/uL (ref 150–400)
RBC: 5.2 MIL/uL (ref 4.22–5.81)
RDW: 13.4 % (ref 11.5–15.5)
WBC: 17.4 K/uL — ABNORMAL HIGH (ref 4.0–10.5)
nRBC: 0 % (ref 0.0–0.2)

## 2024-01-31 LAB — COMPREHENSIVE METABOLIC PANEL WITH GFR
ALT: 230 U/L — ABNORMAL HIGH (ref 0–44)
AST: 129 U/L — ABNORMAL HIGH (ref 15–41)
Albumin: 3.7 g/dL (ref 3.5–5.0)
Alkaline Phosphatase: 570 U/L — ABNORMAL HIGH (ref 38–126)
Anion gap: 12 (ref 5–15)
BUN: 32 mg/dL — ABNORMAL HIGH (ref 8–23)
CO2: 22 mmol/L (ref 22–32)
Calcium: 10.2 mg/dL (ref 8.9–10.3)
Chloride: 93 mmol/L — ABNORMAL LOW (ref 98–111)
Creatinine, Ser: 2.41 mg/dL — ABNORMAL HIGH (ref 0.61–1.24)
GFR, Estimated: 30 mL/min — ABNORMAL LOW
Glucose, Bld: 128 mg/dL — ABNORMAL HIGH (ref 70–99)
Potassium: 4.9 mmol/L (ref 3.5–5.1)
Sodium: 127 mmol/L — ABNORMAL LOW (ref 135–145)
Total Bilirubin: 1.7 mg/dL — ABNORMAL HIGH (ref 0.0–1.2)
Total Protein: 6.9 g/dL (ref 6.5–8.1)

## 2024-01-31 LAB — LIPASE, BLOOD: Lipase: 1693 U/L — ABNORMAL HIGH (ref 11–51)

## 2024-01-31 MED ORDER — HYDROMORPHONE HCL 1 MG/ML IJ SOLN
1.0000 mg | Freq: Once | INTRAMUSCULAR | Status: AC
  Administered 2024-02-01: 1 mg via INTRAVENOUS
  Filled 2024-01-31: qty 1

## 2024-01-31 MED ORDER — LACTATED RINGERS IV BOLUS
1000.0000 mL | Freq: Once | INTRAVENOUS | Status: AC
  Administered 2024-02-01: 1000 mL via INTRAVENOUS

## 2024-01-31 MED ORDER — METRONIDAZOLE 500 MG/100ML IV SOLN
500.0000 mg | Freq: Once | INTRAVENOUS | Status: AC
  Administered 2024-02-01: 500 mg via INTRAVENOUS
  Filled 2024-01-31: qty 100

## 2024-01-31 MED ORDER — CIPROFLOXACIN IN D5W 400 MG/200ML IV SOLN
400.0000 mg | Freq: Once | INTRAVENOUS | Status: AC
  Administered 2024-02-01: 400 mg via INTRAVENOUS
  Filled 2024-01-31: qty 200

## 2024-01-31 NOTE — ED Provider Notes (Signed)
 " [place] EMERGENCY DEPARTMENT AT [facility] Provider Note   CSN: [id number] Arrival date & time: 01/31/24  8064     Patient presents with: Abdominal Pain   Eddie [name] is a 63 y.o. male.  {Add pertinent medical, surgical, social history, OB history to HPI:32947}  Abdominal Pain      Prior to Admission medications  Medication Sig Start Date End Date Taking? Authorizing Provider  hydrochlorothiazide  (HYDRODIURIL ) 12.5 MG tablet Take 12.5 mg by mouth daily.    [provider]  hydrOXYzine (ATARAX/VISTARIL) 25 MG tablet Take 25 mg by mouth every 8 (eight) hours as needed.    [provider]  oxyCODONE -acetaminophen  (PERCOCET/ROXICET) 5-325 MG tablet Take 1 tablet by mouth every 4 (four) hours as needed. 09/03/16   Triplett, Tammy, PA-C  predniSONE  (DELTASONE ) 10 MG tablet Take 6 tablets day one, 5 tablets day two, 4 tablets day three, 3 tablets day four, 2 tablets day five, then 1 tablet day six 09/03/16   Triplett, Tammy, PA-C    Allergies: Patient has no known allergies.    Review of Systems  Gastrointestinal:  Positive for abdominal pain.    Updated Vital Signs BP 109/71   Pulse 70   Temp (!) 95.7 F (35.4 C)   Resp 18   SpO2 97%   Physical Exam  (all labs ordered are listed, but only abnormal results are displayed) Labs Reviewed  LIPASE, BLOOD - Abnormal; Notable for the following components:      Result Value   Lipase 1,693 (*)    All other components within normal limits  COMPREHENSIVE METABOLIC PANEL WITH GFR - Abnormal; Notable for the following components:   Sodium 127 (*)    Chloride 93 (*)    Glucose, Bld 128 (*)    BUN 32 (*)    Creatinine, Ser 2.41 (*)    AST 129 (*)    ALT 230 (*)    Alkaline Phosphatase 570 (*)    Total Bilirubin 1.7 (*)    GFR, Estimated 30 (*)    All other components within normal limits  CBC - Abnormal; Notable for the following components:   WBC 17.4 (*)    All other components within  normal limits  URINALYSIS, ROUTINE W REFLEX MICROSCOPIC - Abnormal; Notable for the following components:   Color, Urine AMBER (*)    APPearance HAZY (*)    Bilirubin Urine MODERATE (*)    Protein, ur 30 (*)    Leukocytes,Ua TRACE (*)    All other components within normal limits    EKG: None  Radiology: No results found.  {Document cardiac monitor, telemetry assessment procedure when appropriate:32947} Procedures   Medications Ordered in the ED  lactated ringers  bolus 1,000 mL (has no administration in time range)  HYDROmorphone  (DILAUDID ) injection 1 mg (has no administration in time range)      {Click here for ABCD2, HEART and other calculators REFRESH Note before signing:1}                              Medical Decision Making Amount and/or Complexity of Data Reviewed Labs: ordered. Radiology: ordered.  Risk Prescription drug management.   ***  {Document critical care time when appropriate  Document review of labs and clinical decision tools ie CHADS2VASC2, etc  Document your independent review of radiology images and any outside records  Document your discussion with family members, caretakers and with consultants  Document social determinants of health affecting pt's care  Document your decision making why or why not admission, treatments were needed:32947:::1}   Final diagnoses:  None    ED Discharge Orders     None        "

## 2024-01-31 NOTE — ED Triage Notes (Signed)
 Patient reports upper abdominal pain and back pain intermittently for 2 months. Patient states this flare up of pain has lasted longer than normal. Hx of diverticulitis. Endorses vomiting.

## 2024-01-31 NOTE — ED Provider Triage Note (Signed)
 Emergency Medicine Provider Triage Evaluation Note  Eddie [name] , a 63 y.o. male  was evaluated in triage.  Pt complains of bilateral upper quadrant abdominal pain for several weeks, worsening today. History of sigmoid diverticulitis. Has been on cipro and flagyl recently  Review of Systems  Positive: Bilateral upper quadrant abdominal pain, chills, vomiting Negative: Dcoumented fever, URI symptoms  Physical Exam  BP 109/71   Pulse 70   Temp (!) 95.7 F (35.4 C)   Resp 18   SpO2 97%  Gen:   Awake, no distress   Resp:  Normal effort  MSK:   Moves extremities without difficulty  Other:  TTP LUQ, epigastrium, RUQ  Medical Decision Making  Medically screening exam initiated at 9:18 PM.  Appropriate orders placed.  Eddie [name] was informed that the remainder of the evaluation will be completed by another provider, this initial triage assessment does not replace that evaluation, and the importance of remaining in the ED until their evaluation is complete.     Claudene Lenis, NP 01/31/24 2122

## 2024-02-01 ENCOUNTER — Inpatient Hospital Stay (HOSPITAL_COMMUNITY)

## 2024-02-01 ENCOUNTER — Encounter (HOSPITAL_COMMUNITY): Payer: Self-pay | Admitting: Internal Medicine

## 2024-02-01 DIAGNOSIS — N179 Acute kidney failure, unspecified: Secondary | ICD-10-CM

## 2024-02-01 DIAGNOSIS — K5790 Diverticulosis of intestine, part unspecified, without perforation or abscess without bleeding: Secondary | ICD-10-CM

## 2024-02-01 DIAGNOSIS — E785 Hyperlipidemia, unspecified: Secondary | ICD-10-CM | POA: Diagnosis present

## 2024-02-01 DIAGNOSIS — I10 Essential (primary) hypertension: Secondary | ICD-10-CM | POA: Diagnosis present

## 2024-02-01 DIAGNOSIS — R6510 Systemic inflammatory response syndrome (SIRS) of non-infectious origin without acute organ dysfunction: Secondary | ICD-10-CM | POA: Diagnosis present

## 2024-02-01 DIAGNOSIS — K2289 Other specified disease of esophagus: Secondary | ICD-10-CM | POA: Diagnosis present

## 2024-02-01 DIAGNOSIS — R748 Abnormal levels of other serum enzymes: Secondary | ICD-10-CM | POA: Diagnosis not present

## 2024-02-01 DIAGNOSIS — R680 Hypothermia, not associated with low environmental temperature: Secondary | ICD-10-CM | POA: Diagnosis present

## 2024-02-01 DIAGNOSIS — K838 Other specified diseases of biliary tract: Secondary | ICD-10-CM | POA: Diagnosis not present

## 2024-02-01 DIAGNOSIS — E871 Hypo-osmolality and hyponatremia: Secondary | ICD-10-CM | POA: Diagnosis present

## 2024-02-01 DIAGNOSIS — K760 Fatty (change of) liver, not elsewhere classified: Secondary | ICD-10-CM | POA: Diagnosis present

## 2024-02-01 DIAGNOSIS — K5732 Diverticulitis of large intestine without perforation or abscess without bleeding: Secondary | ICD-10-CM | POA: Diagnosis present

## 2024-02-01 DIAGNOSIS — R9431 Abnormal electrocardiogram [ECG] [EKG]: Secondary | ICD-10-CM | POA: Diagnosis not present

## 2024-02-01 DIAGNOSIS — Z8249 Family history of ischemic heart disease and other diseases of the circulatory system: Secondary | ICD-10-CM | POA: Diagnosis not present

## 2024-02-01 DIAGNOSIS — E861 Hypovolemia: Secondary | ICD-10-CM | POA: Diagnosis present

## 2024-02-01 DIAGNOSIS — K8061 Calculus of gallbladder and bile duct with cholecystitis, unspecified, with obstruction: Secondary | ICD-10-CM | POA: Diagnosis present

## 2024-02-01 DIAGNOSIS — K219 Gastro-esophageal reflux disease without esophagitis: Secondary | ICD-10-CM | POA: Diagnosis present

## 2024-02-01 DIAGNOSIS — Z79899 Other long term (current) drug therapy: Secondary | ICD-10-CM | POA: Diagnosis not present

## 2024-02-01 DIAGNOSIS — F17210 Nicotine dependence, cigarettes, uncomplicated: Secondary | ICD-10-CM | POA: Diagnosis present

## 2024-02-01 DIAGNOSIS — K8050 Calculus of bile duct without cholangitis or cholecystitis without obstruction: Secondary | ICD-10-CM | POA: Diagnosis not present

## 2024-02-01 DIAGNOSIS — K851 Biliary acute pancreatitis without necrosis or infection: Secondary | ICD-10-CM | POA: Diagnosis present

## 2024-02-01 DIAGNOSIS — K2970 Gastritis, unspecified, without bleeding: Secondary | ICD-10-CM | POA: Diagnosis not present

## 2024-02-01 DIAGNOSIS — F1010 Alcohol abuse, uncomplicated: Secondary | ICD-10-CM

## 2024-02-01 DIAGNOSIS — K859 Acute pancreatitis without necrosis or infection, unspecified: Principal | ICD-10-CM | POA: Diagnosis present

## 2024-02-01 DIAGNOSIS — G8929 Other chronic pain: Secondary | ICD-10-CM | POA: Diagnosis present

## 2024-02-01 DIAGNOSIS — K2960 Other gastritis without bleeding: Secondary | ICD-10-CM | POA: Diagnosis present

## 2024-02-01 LAB — COMPREHENSIVE METABOLIC PANEL WITH GFR
ALT: 185 U/L — ABNORMAL HIGH (ref 0–44)
AST: 89 U/L — ABNORMAL HIGH (ref 15–41)
Albumin: 3.3 g/dL — ABNORMAL LOW (ref 3.5–5.0)
Alkaline Phosphatase: 487 U/L — ABNORMAL HIGH (ref 38–126)
Anion gap: 12 (ref 5–15)
BUN: 31 mg/dL — ABNORMAL HIGH (ref 8–23)
CO2: 22 mmol/L (ref 22–32)
Calcium: 9.6 mg/dL (ref 8.9–10.3)
Chloride: 95 mmol/L — ABNORMAL LOW (ref 98–111)
Creatinine, Ser: 1.93 mg/dL — ABNORMAL HIGH (ref 0.61–1.24)
GFR, Estimated: 39 mL/min — ABNORMAL LOW
Glucose, Bld: 97 mg/dL (ref 70–99)
Potassium: 4.6 mmol/L (ref 3.5–5.1)
Sodium: 129 mmol/L — ABNORMAL LOW (ref 135–145)
Total Bilirubin: 0.8 mg/dL (ref 0.0–1.2)
Total Protein: 6.4 g/dL — ABNORMAL LOW (ref 6.5–8.1)

## 2024-02-01 LAB — CBC
HCT: 42.8 % (ref 39.0–52.0)
Hemoglobin: 15.3 g/dL (ref 13.0–17.0)
MCH: 30.7 pg (ref 26.0–34.0)
MCHC: 35.7 g/dL (ref 30.0–36.0)
MCV: 85.8 fL (ref 80.0–100.0)
Platelets: 149 K/uL — ABNORMAL LOW (ref 150–400)
RBC: 4.99 MIL/uL (ref 4.22–5.81)
RDW: 13.4 % (ref 11.5–15.5)
WBC: 13.2 K/uL — ABNORMAL HIGH (ref 4.0–10.5)
nRBC: 0 % (ref 0.0–0.2)

## 2024-02-01 LAB — LIPID PANEL
Cholesterol: 100 mg/dL (ref 0–200)
HDL: 25 mg/dL — ABNORMAL LOW
LDL Cholesterol: 41 mg/dL (ref 0–99)
Total CHOL/HDL Ratio: 3.9 ratio
Triglycerides: 166 mg/dL — ABNORMAL HIGH
VLDL: 33 mg/dL (ref 0–40)

## 2024-02-01 LAB — HIV ANTIBODY (ROUTINE TESTING W REFLEX): HIV Screen 4th Generation wRfx: NONREACTIVE

## 2024-02-01 LAB — LACTATE DEHYDROGENASE: LDH: 146 U/L (ref 105–235)

## 2024-02-01 LAB — LIPASE, BLOOD: Lipase: 1760 U/L — ABNORMAL HIGH (ref 11–51)

## 2024-02-01 MED ORDER — ONDANSETRON HCL 4 MG/2ML IJ SOLN: 4.0000 mg | Freq: Four times a day (QID) | INTRAMUSCULAR | Status: DC | PRN

## 2024-02-01 MED ORDER — LACTATED RINGERS IV SOLN: INTRAVENOUS | Status: AC

## 2024-02-01 MED ORDER — LACTATED RINGERS IV BOLUS
500.0000 mL | Freq: Once | INTRAVENOUS | Status: AC
  Administered 2024-02-01: 500 mL via INTRAVENOUS

## 2024-02-01 MED ORDER — ADULT MULTIVITAMIN W/MINERALS CH
1.0000 | ORAL_TABLET | Freq: Every day | ORAL | Status: DC
  Administered 2024-02-02 – 2024-02-05 (×4): 1 via ORAL
  Filled 2024-02-01 (×4): qty 1

## 2024-02-01 MED ORDER — METRONIDAZOLE 500 MG/100ML IV SOLN
500.0000 mg | Freq: Two times a day (BID) | INTRAVENOUS | Status: DC
  Administered 2024-02-01 – 2024-02-03 (×4): 500 mg via INTRAVENOUS
  Filled 2024-02-01 (×4): qty 100

## 2024-02-01 MED ORDER — THIAMINE MONONITRATE 100 MG PO TABS: 100.0000 mg | ORAL_TABLET | Freq: Every day | ORAL | Status: DC

## 2024-02-01 MED ORDER — SODIUM CHLORIDE 0.9 % IV BOLUS
500.0000 mL | Freq: Once | INTRAVENOUS | Status: AC
  Administered 2024-02-01: 500 mL via INTRAVENOUS

## 2024-02-01 MED ORDER — LORAZEPAM 1 MG PO TABS: 0.0000 mg | ORAL_TABLET | Freq: Four times a day (QID) | ORAL | Status: AC

## 2024-02-01 MED ORDER — LORAZEPAM 1 MG PO TABS: 1.0000 mg | ORAL_TABLET | ORAL | Status: AC | PRN

## 2024-02-01 MED ORDER — THIAMINE HCL 100 MG/ML IJ SOLN: 100.0000 mg | Freq: Every day | INTRAMUSCULAR | Status: DC

## 2024-02-01 MED ORDER — FOLIC ACID 1 MG PO TABS: 1.0000 mg | ORAL_TABLET | Freq: Every day | ORAL | Status: DC

## 2024-02-01 MED ORDER — LORAZEPAM 2 MG/ML IJ SOLN: 1.0000 mg | INTRAMUSCULAR | Status: AC | PRN

## 2024-02-01 MED ORDER — SODIUM CHLORIDE 0.9 % IV SOLN
2.0000 g | INTRAVENOUS | Status: DC
  Administered 2024-02-01 – 2024-02-02 (×2): 2 g via INTRAVENOUS
  Filled 2024-02-01 (×3): qty 20

## 2024-02-01 MED ORDER — HYDROMORPHONE HCL 1 MG/ML IJ SOLN
0.5000 mg | INTRAMUSCULAR | Status: DC | PRN
  Administered 2024-02-01 – 2024-02-02 (×4): 0.5 mg via INTRAVENOUS
  Filled 2024-02-01 (×4): qty 0.5

## 2024-02-01 MED ORDER — POLYETHYLENE GLYCOL 3350 17 G PO PACK: 17.0000 g | PACK | Freq: Every day | ORAL | Status: DC | PRN

## 2024-02-01 MED ORDER — SODIUM CHLORIDE 0.9 % IV SOLN: INTRAVENOUS | Status: DC

## 2024-02-01 MED ORDER — HYDROMORPHONE HCL 1 MG/ML IJ SOLN
1.0000 mg | INTRAMUSCULAR | Status: DC | PRN
  Filled 2024-02-01: qty 1

## 2024-02-01 MED ORDER — GADOBUTROL 1 MMOL/ML IV SOLN
10.0000 mL | Freq: Once | INTRAVENOUS | Status: AC | PRN
  Administered 2024-02-01: 10 mL via INTRAVENOUS

## 2024-02-01 MED ORDER — ONDANSETRON HCL 4 MG PO TABS: 4.0000 mg | ORAL_TABLET | Freq: Four times a day (QID) | ORAL | Status: DC | PRN

## 2024-02-01 MED ORDER — LORAZEPAM 1 MG PO TABS: 0.0000 mg | ORAL_TABLET | Freq: Two times a day (BID) | ORAL | Status: AC

## 2024-02-01 NOTE — Consult Note (Signed)
 Reason for Consult: Pancreatitis Referring Physician: Hospital team  Eddie [name] is an 64 y.o. male.  HPI: Patient seen and examined and his hospital computer chart as well as our office computer chart were reviewed and he has had episodic midepigastric abdominal pain radiating to his back in early December saw his primary care physician who found his liver tests up to about the same level they are now and told him to cut back on drinking and treated him for diverticulitis although he does not believe he had a fever but when the pain continued he had increased nausea he came to the emergency room and was diagnosed with alcohol versus gallstone pancreatitis and his case discussed with the ER physician and his MRCP did show a CBD stone and the good news is he is feeling better today and would like to eat and has no other complaints  Past Medical History:  Diagnosis Date   Diverticulitis    Hypertension     Past Surgical History:  Procedure Laterality Date   HERNIA REPAIR     LAPAROSCOPIC APPENDECTOMY N/A 03/01/2013   Procedure: APPENDECTOMY LAPAROSCOPIC;  Surgeon: Oneil DELENA Budge, MD;  Location: [facility];  Service: General;  Laterality: N/A;    History reviewed. No pertinent family history.  Social History:  reports that he has been smoking. He does not have any smokeless tobacco history on file. He reports current alcohol use. He reports that he does not use drugs.  Allergies: Allergies[1]  Medications: I have reviewed the patient's current medications.  Results for orders placed or performed during the hospital encounter of 01/31/24 (from the past 48 hours)  Lipase, blood     Status: Abnormal   Collection Time: 01/31/24  8:41 PM  Result Value Ref Range   Lipase 1,693 (H) 11 - 51 U/L    Comment: Performed at [facility], [address], [place], [place] [address]  Comprehensive metabolic panel     Status: Abnormal   Collection Time: 01/31/24  8:41 PM  Result Value Ref Range    Sodium 127 (L) 135 - 145 mmol/L   Potassium 4.9 3.5 - 5.1 mmol/L   Chloride 93 (L) 98 - 111 mmol/L   CO2 22 22 - 32 mmol/L   Glucose, Bld 128 (H) 70 - 99 mg/dL    Comment: Glucose reference range applies only to samples taken after fasting for at least 8 hours.   BUN 32 (H) 8 - 23 mg/dL   Creatinine, Ser 7.58 (H) 0.61 - 1.24 mg/dL   Calcium 89.7 8.9 - 89.6 mg/dL   Total Protein 6.9 6.5 - 8.1 g/dL   Albumin 3.7 3.5 - 5.0 g/dL   AST 870 (H) 15 - 41 U/L   ALT 230 (H) 0 - 44 U/L   Alkaline Phosphatase 570 (H) 38 - 126 U/L   Total Bilirubin 1.7 (H) 0.0 - 1.2 mg/dL   GFR, Estimated 30 (L) >60 mL/min    Comment: (NOTE) Calculated using the CKD-EPI Creatinine Equation (2021)    Anion gap 12 5 - 15    Comment: Performed at [facility], [address] [place], [place] [address]  CBC     Status: Abnormal   Collection Time: 01/31/24  8:41 PM  Result Value Ref Range   WBC 17.4 (H) 4.0 - 10.5 K/uL   RBC 5.20 4.22 - 5.81 MIL/uL   Hemoglobin 16.1 13.0 - 17.0 g/dL   HCT 54.6 60.9 - 47.9 %  MCV 87.1 80.0 - 100.0 fL   MCH 31.0 26.0 - 34.0 pg   MCHC 35.5 30.0 - 36.0 g/dL   RDW 86.5 88.4 - 84.4 %   Platelets 170 150 - 400 K/uL   nRBC 0.0 0.0 - 0.2 %    Comment: Performed at [facility], [address], [place], [place] [address]  Urinalysis, Routine w reflex microscopic -Urine, Clean Catch     Status: Abnormal   Collection Time: 01/31/24  8:45 PM  Result Value Ref Range   Color, Urine AMBER (A) YELLOW    Comment: BIOCHEMICALS MAY BE AFFECTED BY COLOR   APPearance HAZY (A) CLEAR   Specific Gravity, Urine 1.024 1.005 - 1.030   pH 5.0 5.0 - 8.0   Glucose, UA NEGATIVE NEGATIVE mg/dL   Hgb urine dipstick NEGATIVE NEGATIVE   Bilirubin Urine MODERATE (A) NEGATIVE   Ketones, ur NEGATIVE NEGATIVE mg/dL   Protein, ur 30 (A) NEGATIVE mg/dL   Nitrite NEGATIVE NEGATIVE   Leukocytes,Ua TRACE (A) NEGATIVE   RBC / HPF 0-5 0 - 5 RBC/hpf   WBC, UA 11-20 0 - 5 WBC/hpf   Bacteria, UA  NONE SEEN NONE SEEN   Squamous Epithelial / HPF 0-5 0 - 5 /HPF   Mucus PRESENT    Hyaline Casts, UA PRESENT     Comment: Performed at [facility], [address], [place], [place] [address]  Blood culture (routine x 2)     Status: None (Preliminary result)   Collection Time: 01/31/24 11:57 PM   Specimen: BLOOD  Result Value Ref Range   Specimen Description BLOOD SITE NOT SPECIFIED    Special Requests      BOTTLES DRAWN AEROBIC AND ANAEROBIC Blood Culture adequate volume   Culture      NO GROWTH < 12 HOURS Performed at [facility], [address], [place], [place] [address]    Report Status PENDING   Blood culture (routine x 2)     Status: None (Preliminary result)   Collection Time: 02/01/24 12:02 AM   Specimen: BLOOD  Result Value Ref Range   Specimen Description BLOOD RIGHT ANTECUBITAL    Special Requests      BOTTLES DRAWN AEROBIC AND ANAEROBIC Blood Culture adequate volume   Culture      NO GROWTH < 12 HOURS Performed at [facility], [address] [place], [place] [address]    Report Status PENDING   HIV Antibody (routine testing w rflx)     Status: None   Collection Time: 02/01/24  4:10 AM  Result Value Ref Range   HIV Screen 4th Generation wRfx Non Reactive Non Reactive    Comment: Performed at [facility], [address] [place], [place] [address]  Comprehensive metabolic panel     Status: Abnormal   Collection Time: 02/01/24  4:10 AM  Result Value Ref Range   Sodium 129 (L) 135 - 145 mmol/L   Potassium 4.6 3.5 - 5.1 mmol/L    Comment: HEMOLYSIS AT THIS LEVEL MAY AFFECT RESULT   Chloride 95 (L) 98 - 111 mmol/L   CO2 22 22 - 32 mmol/L   Glucose, Bld 97 70 - 99 mg/dL    Comment: Glucose reference range applies only to samples taken after fasting for at least 8 hours.   BUN 31 (H) 8 - 23 mg/dL   Creatinine, Ser 8.06 (H) 0.61 - 1.24 mg/dL   Calcium 9.6 8.9 - 10.3  mg/dL   Total Protein 6.4 (L) 6.5 - 8.1 g/dL   Albumin 3.3 (L) 3.5 - 5.0  g/dL   AST 89 (H) 15 - 41 U/L    Comment: HEMOLYSIS AT THIS LEVEL MAY AFFECT RESULT   ALT 185 (H) 0 - 44 U/L   Alkaline Phosphatase 487 (H) 38 - 126 U/L   Total Bilirubin 0.8 0.0 - 1.2 mg/dL   GFR, Estimated 39 (L) >60 mL/min    Comment: (NOTE) Calculated using the CKD-EPI Creatinine Equation (2021)    Anion gap 12 5 - 15    Comment: Performed at [facility], [address] [place], [place] [address]  CBC     Status: Abnormal   Collection Time: 02/01/24  4:10 AM  Result Value Ref Range   WBC 13.2 (H) 4.0 - 10.5 K/uL   RBC 4.99 4.22 - 5.81 MIL/uL   Hemoglobin 15.3 13.0 - 17.0 g/dL   HCT 57.1 60.9 - 47.9 %   MCV 85.8 80.0 - 100.0 fL   MCH 30.7 26.0 - 34.0 pg   MCHC 35.7 30.0 - 36.0 g/dL   RDW 86.5 88.4 - 84.4 %   Platelets 149 (L) 150 - 400 K/uL   nRBC 0.0 0.0 - 0.2 %    Comment: Performed at [facility], [address] [place], [place] [address]  Lipid panel     Status: Abnormal   Collection Time: 02/01/24  4:10 AM  Result Value Ref Range   Cholesterol 100 0 - 200 mg/dL    Comment:        ATP III CLASSIFICATION:  <200     mg/dL   Desirable  799-760  mg/dL   Borderline High  >=759    mg/dL   High           Triglycerides 166 (H) <150 mg/dL   HDL 25 (L) >59 mg/dL   Total CHOL/HDL Ratio 3.9 RATIO   VLDL 33 0 - 40 mg/dL   LDL Cholesterol 41 0 - 99 mg/dL    Comment:        Total Cholesterol/HDL:CHD Risk Coronary Heart Disease Risk Table                     Men   Women  1/2 Average Risk   3.4   3.3  Average Risk       5.0   4.4  2 X Average Risk   9.6   7.1  3 X Average Risk  23.4   11.0        Use the calculated Patient Ratio above and the CHD Risk Table to determine the patient's CHD Risk.        ATP III CLASSIFICATION (LDL):  <100     mg/dL   Optimal  899-870  mg/dL   Near or Above                    Optimal  130-159  mg/dL   Borderline  839-810  mg/dL   High  >809     mg/dL   Very High Performed at [facility], [address],  [place], [place] [address]   Lipase, blood     Status: Abnormal   Collection Time: 02/01/24  4:10 AM  Result Value Ref Range   Lipase 1,760 (H) 11 - 51 U/L    Comment: Performed at [facility], [address], [place], [place]  [address]    MR ABDOMEN MRCP W WO CONTRAST Result Date: 02/01/2024 EXAM: MRCP WITH AND WITHOUT IV CONTRAST 02/01/2024 06:16:04 AM TECHNIQUE: Multisequence, multiplanar magnetic resonance images of the abdomen with and without intravenous contrast. MRCP sequences were performed. 10 mL gadobutrol (GADAVIST) 1 MMOL/ML injection. COMPARISON: CT abdomen and pelvis 07/07/2023. CLINICAL HISTORY: The patient reports experiencing upper abdominal pain and back pain intermittently for 2 months. The patient endorses vomiting. Abnormal elevated lipase of 1693. FINDINGS: LIVER: Equivocal hepatic steatosis. No suspicious enhancing liver lesion. GALLBLADDER AND BILIARY SYSTEM: Gallbladder wall thickening without surrounding inflammatory fat stranding measuring 4 mm. The common bile duct is dilated and there is intrahepatic bile duct dilatation. The CBD measures up to 1.4 cm. Within the distal common bile duct there is a large stone which measures 1.5 x 1.1 cm, image 17/3. Fluid signal intensity. SPLEEN: Unremarkable. PANCREAS/PANCREATIC DUCT: Visualized pancreas is unremarkable. No significant main duct dilatation. No pancreatic mass identified. No signs of pseudocyst or pancreatic necrosis. ADRENAL GLANDS: Unremarkable. KIDNEYS: Bosniak class I left kidney cyst measures 7 mm, image 21/4. LYMPH NODES: No enlarged abdominal lymph nodes. VASCULATURE: Unremarkable. PERITONEUM: No ascites. ABDOMINAL WALL: No hernia. No mass. 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 Grossly unremarkable. No bowel obstruction. BONES: No acute abnormality or worrisome osseous lesion. SOFT TISSUES: Unremarkable. LUNGS: Atelectasis or consolidation noted within  the right lung base. MISCELLANEOUS: The most likely differential diagnosis based on these findings is choledocholithiasis with secondary cholangitis and/or pancreatitis. Follow-up guidelines would typically involve endoscopic retrograde cholangiopancreatography (ERCP) for stone extraction and/or cholecystectomy. IMPRESSION: 1. Obstructing distal choledocholithiasis with intrahepatic and extrahepatic biliary dilatation (CBD up to 1.4 cm), most consistent with gallstone pancreatitis in the setting of elevated lipase. 2. Gallbladder wall thickening (4 mm) without pericholecystic inflammatory fat stranding, which may be reactive. 3. No pancreatic mass, pseudocyst, necrosis, or significant main pancreatic duct dilatation. Electronically signed by: Waddell Calk MD 02/01/2024 06:49 AM EST RP Workstation: [id number]   MR 3D Recon At Scanner Result Date: 02/01/2024 EXAM: MRCP WITH AND WITHOUT IV CONTRAST 02/01/2024 06:16:04 AM TECHNIQUE: Multisequence, multiplanar magnetic resonance images of the abdomen with and without intravenous contrast. MRCP sequences were performed. 10 mL gadobutrol (GADAVIST) 1 MMOL/ML injection. COMPARISON: CT abdomen and pelvis 07/07/2023. CLINICAL HISTORY: The patient reports experiencing upper abdominal pain and back pain intermittently for 2 months. The patient endorses vomiting. Abnormal elevated lipase of 1693. FINDINGS: LIVER: Equivocal hepatic steatosis. No suspicious enhancing liver lesion. GALLBLADDER AND BILIARY SYSTEM: Gallbladder wall thickening without surrounding inflammatory fat stranding measuring 4 mm. The common bile duct is dilated and there is intrahepatic bile duct dilatation. The CBD measures up to 1.4 cm. Within the distal common bile duct there is a large stone which measures 1.5 x 1.1 cm, image 17/3. Fluid signal intensity. SPLEEN: Unremarkable. PANCREAS/PANCREATIC DUCT: Visualized pancreas is unremarkable. No significant main duct dilatation. No pancreatic mass  identified. No signs of pseudocyst or pancreatic necrosis. ADRENAL GLANDS: Unremarkable. KIDNEYS: Bosniak class I left kidney cyst measures 7 mm, image 21/4. LYMPH NODES: No enlarged abdominal lymph nodes. VASCULATURE: Unremarkable. PERITONEUM: No ascites. ABDOMINAL WALL: No hernia. No mass. 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 Grossly unremarkable. No bowel obstruction. BONES: No acute abnormality or worrisome osseous lesion. SOFT TISSUES: Unremarkable. LUNGS: Atelectasis or consolidation noted within the right lung base. MISCELLANEOUS: The most likely differential diagnosis based on these findings is choledocholithiasis with secondary cholangitis  and/or pancreatitis. Follow-up guidelines would typically involve endoscopic retrograde cholangiopancreatography (ERCP) for stone extraction and/or cholecystectomy. IMPRESSION: 1. Obstructing distal choledocholithiasis with intrahepatic and extrahepatic biliary dilatation (CBD up to 1.4 cm), most consistent with gallstone pancreatitis in the setting of elevated lipase. 2. Gallbladder wall thickening (4 mm) without pericholecystic inflammatory fat stranding, which may be reactive. 3. No pancreatic mass, pseudocyst, necrosis, or significant main pancreatic duct dilatation. Electronically signed by: Waddell Calk MD 02/01/2024 06:49 AM EST RP Workstation: [id number]   US  Abdomen Limited RUQ (LIVER/GB) Result Date: 02/01/2024 EXAM: Right Upper Quadrant Abdominal Ultrasound 01/31/2024 11:58:00 PM TECHNIQUE: Real-time ultrasonography of the right upper quadrant of the abdomen was performed. COMPARISON: CT abdomen and pelvis 07/07/2023. CLINICAL HISTORY: Abdominal pain. FINDINGS: LIVER: Increased echogenicity throughout the liver. No intrahepatic biliary ductal dilatation. No evidence of mass. Hepatopetal flow in the portal vein. BILIARY SYSTEM: Gallbladder wall is mildly thickened measuring 3.8 mm.  Sonographic Beverley sign is negative. No cholelithiasis. Common bile duct measures 6.7 mm. RIGHT KIDNEY: No hydronephrosis. No echogenic calculi. No mass. PANCREAS: Visualized portions of the pancreas are unremarkable. OTHER: No right upper quadrant ascites. IMPRESSION: 1. Mildly thickened gallbladder wall measuring 3.8 mm with negative sonographic Murphy sign for, nonspecific. 2. Increased echogenicity throughout the liver compatible with the facet of the osseous disease such as fatty infiltration . 3. Common bile duct measures 6.7 mm, borderline dilated. Recommend correlation with lab values. Electronically signed by: Greig Pique MD 02/01/2024 12:25 AM EST RP Workstation: [id number]    ROS negative except above his only surgery in the past was an appendectomy and he is not on any aspirin  or blood thinner at home Blood pressure 120/73, pulse (!) 51, temperature 98 F (36.7 C), temperature source Oral, resp. rate 16, SpO2 99%. Physical Exam Vital signs stable afebrile no acute distress abdomen is soft nontender MRCP reviewed ultrasound reviewed lipase slight increase BUN and creatinine and liver test slight decrease white count slight decrease Assessment/Plan: Alcohol versus gallstone pancreatitis in patient with CBD stone Plan: Will allow clear liquids today repeat labs tomorrow and the risk benefits methods and success rate of ERCP was discussed with the patient and we will proceed over the next few days when pancreatitis seems to decrease and we will have rounding partner check on tomorrow to help discussed the timing of that procedure  Raygan Skarda E 02/01/2024, 11:12 AM         [1] No Known Allergies

## 2024-02-01 NOTE — H&P (Addendum)
 " History and Physical    Eddie [name] FMW:[id number] DOB: [birth year] DOA: 01/31/2024  PCP: [facility]   Patient coming from: Home  I have personally briefly reviewed patient's old medical records in [facility]  Chief Complaint: Abdominal pain  HPI: Eddie [name] is a 64 y.o. male with medical history significant for diverticulosis, hypertension. Patient presented to ED with complaints of upper abdominal pain radiating to the back that has been going on for several weeks to months, today pain significantly worsened so he came to the ED.  No vomiting no diarrhea. Earlier this month December 1st he saw his outpatient GI provider, he was started on a course of Cipro and Flagyl, he completed 2 courses 7 days of antibiotics 1 week apart, and has been on a bland diet, like Jell-O and similar.  He denies urinary symptoms.  He has a chronic unchanged cough.  He reports his last alcoholic beverage was about a week ago, before this he drank alcohol daily.  ED Course: Temperature 98.3.  Heart rate 56-70.  Respirate rate 18-20.  Blood pressure systolic 191 and 19.  O2 sats greater 97% room air. WBC 17.4 Lipase 1693 Elevated liver enzymes AST 129, ALT 230, ALP 570, T. bili 1.7. Creatinine elevated 2.41 Sodium 127. UA with trace leukocytes 1 L bolus given. RUQ Us -  Mildly thickened gallbladder wall measuring 3.8 mm with negative sonographic Murphy sign for, nonspecific. 2. Increased echogenicity throughout the liver compatible with the facet of the osseous disease such as fatty infiltration . Blood cultures obtained. IV ciprofloxacin and metronidazole started. EDP talked to [facility] Dr. Rosalie recommended MRCP, team will see in consult in AM.   Review of Systems: As per HPI all other systems reviewed and negative.  Past Medical History:  Diagnosis Date   Diverticulitis    Hypertension     Past Surgical History:  Procedure Laterality Date   HERNIA REPAIR      LAPAROSCOPIC APPENDECTOMY N/A 03/01/2013   Procedure: APPENDECTOMY LAPAROSCOPIC;  Surgeon: Oneil DELENA Budge, MD;  Location: [facility];  Service: General;  Laterality: N/A;     reports that he has been smoking. He does not have any smokeless tobacco history on file. He reports current alcohol use. He reports that he does not use drugs.  Allergies[1]  Family history of hypertension.  Prior to Admission medications  Medication Sig Start Date End Date Taking? Authorizing Provider  hydrochlorothiazide  (HYDRODIURIL ) 12.5 MG tablet Take 12.5 mg by mouth daily.    [provider]  hydrOXYzine (ATARAX/VISTARIL) 25 MG tablet Take 25 mg by mouth every 8 (eight) hours as needed.    [provider]  oxyCODONE -acetaminophen  (PERCOCET/ROXICET) 5-325 MG tablet Take 1 tablet by mouth every 4 (four) hours as needed. 09/03/16   Triplett, Tammy, PA-C  predniSONE  (DELTASONE ) 10 MG tablet Take 6 tablets day one, 5 tablets day two, 4 tablets day three, 3 tablets day four, 2 tablets day five, then 1 tablet day six 09/03/16   Herlinda Milling, PA-C    Physical Exam: Vitals:   01/31/24 1943 02/01/24 0035  BP: 109/71 119/72  Pulse: 70 (!) 56  Resp: 18 20  Temp: (!) 95.7 F (35.4 C) 98.3 F (36.8 C)  SpO2: 97% 98%    Constitutional: NAD, calm, comfortable Vitals:   01/31/24 1943 02/01/24 0035  BP: 109/71 119/72  Pulse: 70 (!) 56  Resp: 18 20  Temp: (!) 95.7 F (35.4 C) 98.3 F (36.8  C)  SpO2: 97% 98%   Eyes: PERRL, lids and conjunctivae normal ENMT: Mucous membranes are mildly dry .  Neck: normal, supple, no masses, no thyromegaly Respiratory: clear to auscultation bilaterally, no wheezing, no crackles. Normal respiratory effort. No accessory muscle use.  Cardiovascular: Regular rate and rhythm, no murmurs / rubs / gallops. No extremity edema.  Extremities warm.  Abdomen: Minimal epigastric tenderness, no masses palpated. No hepatosplenomegaly  Musculoskeletal: no clubbing / cyanosis. No  joint deformity upper and lower extremities.  Skin: no rashes, lesions, ulcers. No induration Neurologic: Moving extremities spontaneously, speech fluent Psychiatric: Normal judgment and insight. Alert and oriented x 3. Normal mood.   Labs on Admission: I have personally reviewed following labs and imaging studies  CBC: Recent Labs  Lab 01/31/24 2041  WBC 17.4*  HGB 16.1  HCT 45.3  MCV 87.1  PLT 170   Basic Metabolic Panel: Recent Labs  Lab 01/31/24 2041  NA 127*  K 4.9  CL 93*  CO2 22  GLUCOSE 128*  BUN 32*  CREATININE 2.41*  CALCIUM 10.2   GFR: CrCl cannot be calculated (Unknown ideal weight.). Liver Function Tests: Recent Labs  Lab 01/31/24 2041  AST 129*  ALT 230*  ALKPHOS 570*  BILITOT 1.7*  PROT 6.9  ALBUMIN 3.7   Recent Labs  Lab 01/31/24 2041  LIPASE 1,693*   Urine analysis:    Component Value Date/Time   COLORURINE AMBER (A) 01/31/2024 2045   APPEARANCEUR HAZY (A) 01/31/2024 2045   LABSPEC 1.024 01/31/2024 2045   PHURINE 5.0 01/31/2024 2045   GLUCOSEU NEGATIVE 01/31/2024 2045   HGBUR NEGATIVE 01/31/2024 2045   BILIRUBINUR MODERATE (A) 01/31/2024 2045   KETONESUR NEGATIVE 01/31/2024 2045   PROTEINUR 30 (A) 01/31/2024 2045   NITRITE NEGATIVE 01/31/2024 2045   LEUKOCYTESUR TRACE (A) 01/31/2024 2045    Radiological Exams on Admission: US  Abdomen Limited RUQ (LIVER/GB) Result Date: 02/01/2024 EXAM: Right Upper Quadrant Abdominal Ultrasound 01/31/2024 11:58:00 PM TECHNIQUE: Real-time ultrasonography of the right upper quadrant of the abdomen was performed. COMPARISON: CT abdomen and pelvis 07/07/2023. CLINICAL HISTORY: Abdominal pain. FINDINGS: LIVER: Increased echogenicity throughout the liver. No intrahepatic biliary ductal dilatation. No evidence of mass. Hepatopetal flow in the portal vein. BILIARY SYSTEM: Gallbladder wall is mildly thickened measuring 3.8 mm. Sonographic Beverley sign is negative. No cholelithiasis. Common bile duct measures 6.7  mm. RIGHT KIDNEY: No hydronephrosis. No echogenic calculi. No mass. PANCREAS: Visualized portions of the pancreas are unremarkable. OTHER: No right upper quadrant ascites. IMPRESSION: 1. Mildly thickened gallbladder wall measuring 3.8 mm with negative sonographic Murphy sign for, nonspecific. 2. Increased echogenicity throughout the liver compatible with the facet of the osseous disease such as fatty infiltration . 3. Common bile duct measures 6.7 mm, borderline dilated. Recommend correlation with lab values. Electronically signed by: Greig Pique MD 02/01/2024 12:25 AM EST RP Workstation: [id number]   EKG: None  Assessment/Plan Principal Problem:   Acute pancreatitis Active Problems:   Elevated liver enzymes   Essential hypertension   Alcohol abuse   Diverticulosis   AKI (acute kidney injury)  Assessment and Plan:  Acute pancreatitis with elevated liver enzymes-lipase 1693, AST 129, ALT 230, ALP 570, T. bili 1.7.  Leukocytosis-17.4 otherwise stable vitals.  Not meeting SIRS criteria.   - RUQ Us -  Mildly thickened gallbladder wall measuring 3.8 mm with negative sonographic Murphy, Increased echogenicity throughout the liver compatible with the facet of the osseous disease such as fatty infiltration .  Borderline dilated CBD-6.7 mm.  Completed  2 courses of IV Cipro and metronidazole as outpatient.  Follows with [facility].  Last alcoholic beverage about a week ago. - EDP to call GI on-call Dr. Rosalie, MRCP, team will see in consult in a.m. - IV ceftriaxone and metronidazole - IV Dilaudid  q.4 hr prn - Bowel rest, n.p.o. - Check triglycerides  AKI-creatinine 2.41, baseline 0.8. - 1.5 L bolus given, continue N/s 100cc/hr x 1 day  Chronic hyponatremia-sodium 127.  Last check 7 months ago, sodium was 129. - Hydrate with normal saline  Hypertension-stable.  Alcohol abuse-reports daily alcohol use, reports last alcoholic beverage was about a week ago.  Vital stable.  Patient calm, no sign of  withdrawal. -CIWA as needed - N.p.o. for now, consider thiamine folate multivitamins when able - K4.9, check magnesium  DVT prophylaxis: SCDs pending GI eval, MRCP Code Status: Full code Family Communication: None at bedside Disposition Plan: ~ 2 days Consults called: GI- [facility].  Admission status: Inpatient, MedSurg I certify that at the point of admission it is my clinical judgment that the patient will require inpatient hospital care spanning beyond 2 midnights from the point of admission due to high intensity of service, high risk for further deterioration and high frequency of surveillance required.   Author: Tully FORBES Carwin, MD 02/01/2024 6:00 AM  For on call review [email or web address].      [1] No Known Allergies  "

## 2024-02-01 NOTE — Progress Notes (Signed)
 " PROGRESS NOTE    Eddie [name]  FMW:[id number] DOB: 1961/01/25 DOA: 01/31/2024 PCP: [facility] Complaint  Patient presents with   Abdominal Pain    Brief Narrative:   Eddie [name] is Eddie [name] 64 y.o. male with medical history significant for diverticulosis, hypertension who presented with acute on chronic abdominal pain, ongoing for weeks, which worsened leading to him presenting to the hospital.    He's been admitted with gallstone pancreatitis.   Assessment & Plan:   Principal Problem:   Acute pancreatitis Active Problems:   Elevated liver enzymes   Essential hypertension   Alcohol abuse   Diverticulosis   AKI (acute kidney injury)  Gallstone Pancreatitis Choledocholithiasis  MRCP with obstructing choledocholithiasis with intrahepatic and extrahepatic biliary dilatation LFT's improved today - alk phos, AST, ALT remain elevated.  Bili normalized. Will continue abx for now GI c/s, appreciate recs Will need surgery c/s for cholecystectomy after ERCP Continue IVF  Strict I/O, daily weights  Systemic Inflammatory Response Syndrome Hypothermia, leukocytosis at presentation  Possible sign of infection in setting of above Abx as above to cover possible cholangitis  Acute Kidney Injury Improving with IVF UA with 0-5 RBC, 30 mg/dl protein - 88-79 WBC Consider renal US  if not improving   HTN Holding amlodipine, benicar for now - BP now on low side, follow with IVF  Hyponatremia Improving with IVF, at least partially related to hypovolemia  Dyslipidemia Holding crestor   Etoh Abuse Patient reports last drink 1 week ago CIWA  GERD PPI    DVT prophylaxis: SCD Code Status: full Family Communication: none Disposition:   Status is: Inpatient Remains inpatient appropriate because: need for continued inpatient care   Consultants:  GI, magod  Procedures:  none  Antimicrobials:  Anti-infectives (From admission, onward)     Start     Dose/Rate Route Frequency Ordered Stop   02/01/24 2200  cefTRIAXone (ROCEPHIN) 2 g in sodium chloride  0.9 % 100 mL IVPB        2 g 200 mL/hr over 30 Minutes Intravenous Every 24 hours 02/01/24 0600     02/01/24 1400  metroNIDAZOLE (FLAGYL) IVPB 500 mg        500 mg 100 mL/hr over 60 Minutes Intravenous Every 12 hours 02/01/24 0600     02/01/24 0000  ciprofloxacin (CIPRO) IVPB 400 mg        400 mg 200 mL/hr over 60 Minutes Intravenous  Once 01/31/24 2355 02/01/24 0312   02/01/24 0000  metroNIDAZOLE (FLAGYL) IVPB 500 mg        500 mg 100 mL/hr over 60 Minutes Intravenous  Once 01/31/24 2355 02/01/24 0204       Subjective: Complaining of continued epigastric pain  Objective: Vitals:   02/01/24 0337 02/01/24 0348 02/01/24 0358 02/01/24 0644  BP: (!) 96/54  (!) 96/58 (!) 90/58  Pulse: (!) 51   60  Resp: 20   20  Temp:  98.3 F (36.8 C)  97.8 F (36.6 C)  TempSrc:  Oral  Oral  SpO2: 99%   99%    Intake/Output Summary (Last 24 hours) at 02/01/2024 0804 Last data filed at 02/01/2024 9374 Gross per 24 hour  Intake 1697.97 ml  Output --  Net 1697.97 ml   There were no vitals filed for this visit.  Examination:  General exam: Appears calm and comfortable  Respiratory system: Clear to auscultation. Respiratory effort normal. Cardiovascular system: S1 & S2 heard, RRR. No JVD, murmurs,  rubs, gallops or clicks. No pedal edema. Gastrointestinal system: Abdomen is nondistended, soft and nontender. No organomegaly or masses felt. Normal bowel sounds heard. Central nervous system: Alert and oriented. No focal neurological deficits. Extremities: Symmetric 5 x 5 power. Skin: No rashes, lesions or ulcers Psychiatry: Judgement and insight appear normal. Mood & affect appropriate.     Data Reviewed: I have personally reviewed following labs and imaging studies  CBC: Recent Labs  Lab 01/31/24 2041 02/01/24 0410  WBC 17.4* 13.2*  HGB 16.1 15.3  HCT 45.3 42.8  MCV 87.1  85.8  PLT 170 149*    Basic Metabolic Panel: Recent Labs  Lab 01/31/24 2041 02/01/24 0410  NA 127* 129*  K 4.9 4.6  CL 93* 95*  CO2 22 22  GLUCOSE 128* 97  BUN 32* 31*  CREATININE 2.41* 1.93*  CALCIUM 10.2 9.6    GFR: CrCl cannot be calculated (Unknown ideal weight.).  Liver Function Tests: Recent Labs  Lab 01/31/24 2041 02/01/24 0410  AST 129* 89*  ALT 230* 185*  ALKPHOS 570* 487*  BILITOT 1.7* 0.8  PROT 6.9 6.4*  ALBUMIN 3.7 3.3*    CBG: No results for input(s): GLUCAP in the last 168 hours.   Recent Results (from the past 240 hours)  Blood culture (routine x 2)     Status: None (Preliminary result)   Collection Time: 01/31/24 11:57 PM   Specimen: BLOOD  Result Value Ref Range Status   Specimen Description BLOOD SITE NOT SPECIFIED  Final   Special Requests   Final    BOTTLES DRAWN AEROBIC AND ANAEROBIC Blood Culture adequate volume   Culture   Final    NO GROWTH < 12 HOURS Performed at [facility], [address], [place], [place] [address]    Report Status PENDING  Incomplete  Blood culture (routine x 2)     Status: None (Preliminary result)   Collection Time: 02/01/24 12:02 AM   Specimen: BLOOD  Result Value Ref Range Status   Specimen Description BLOOD RIGHT ANTECUBITAL  Final   Special Requests   Final    BOTTLES DRAWN AEROBIC AND ANAEROBIC Blood Culture adequate volume   Culture   Final    NO GROWTH < 12 HOURS Performed at [facility], [address] [place], [place] [address]    Report Status PENDING  Incomplete         Radiology Studies: MR ABDOMEN MRCP W WO CONTRAST Result Date: 02/01/2024 EXAM: MRCP WITH AND WITHOUT IV CONTRAST 02/01/2024 06:16:04 AM TECHNIQUE: Multisequence, multiplanar magnetic resonance images of the abdomen with and without intravenous contrast. MRCP sequences were performed. 10 mL gadobutrol (GADAVIST) 1 MMOL/ML injection. COMPARISON: CT abdomen and pelvis 07/07/2023. CLINICAL HISTORY: The patient  reports experiencing upper abdominal pain and back pain intermittently for 2 months. The patient endorses vomiting. Abnormal elevated lipase of 1693. FINDINGS: LIVER: Equivocal hepatic steatosis. No suspicious enhancing liver lesion. GALLBLADDER AND BILIARY SYSTEM: Gallbladder wall thickening without surrounding inflammatory fat stranding measuring 4 mm. The common bile duct is dilated and there is intrahepatic bile duct dilatation. The CBD measures up to 1.4 cm. Within the distal common bile duct there is Marci Polito large stone which measures 1.5 x 1.1 cm, image 17/3. Fluid signal intensity. SPLEEN: Unremarkable. PANCREAS/PANCREATIC DUCT: Visualized pancreas is unremarkable. No significant main duct dilatation. No pancreatic mass identified. No signs of pseudocyst or pancreatic necrosis. ADRENAL GLANDS: Unremarkable. KIDNEYS: Bosniak class I left kidney cyst measures 7 mm, image 21/4. LYMPH  NODES: No enlarged abdominal lymph nodes. VASCULATURE: Unremarkable. PERITONEUM: No ascites. ABDOMINAL WALL: No hernia. No mass. 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 Grossly unremarkable. No bowel obstruction. BONES: No acute abnormality or worrisome osseous lesion. SOFT TISSUES: Unremarkable. LUNGS: Atelectasis or consolidation noted within the right lung base. MISCELLANEOUS: The most likely differential diagnosis based on these findings is choledocholithiasis with secondary cholangitis and/or pancreatitis. Follow-up guidelines would typically involve endoscopic retrograde cholangiopancreatography (ERCP) for stone extraction and/or cholecystectomy. IMPRESSION: 1. Obstructing distal choledocholithiasis with intrahepatic and extrahepatic biliary dilatation (CBD up to 1.4 cm), most consistent with gallstone pancreatitis in the setting of elevated lipase. 2. Gallbladder wall thickening (4 mm) without pericholecystic inflammatory fat stranding, which may be reactive. 3. No  pancreatic mass, pseudocyst, necrosis, or significant main pancreatic duct dilatation. Electronically signed by: Waddell Calk MD 02/01/2024 06:49 AM EST RP Workstation: [id number]   MR 3D Recon At Scanner Result Date: 02/01/2024 EXAM: MRCP WITH AND WITHOUT IV CONTRAST 02/01/2024 06:16:04 AM TECHNIQUE: Multisequence, multiplanar magnetic resonance images of the abdomen with and without intravenous contrast. MRCP sequences were performed. 10 mL gadobutrol (GADAVIST) 1 MMOL/ML injection. COMPARISON: CT abdomen and pelvis 07/07/2023. CLINICAL HISTORY: The patient reports experiencing upper abdominal pain and back pain intermittently for 2 months. The patient endorses vomiting. Abnormal elevated lipase of 1693. FINDINGS: LIVER: Equivocal hepatic steatosis. No suspicious enhancing liver lesion. GALLBLADDER AND BILIARY SYSTEM: Gallbladder wall thickening without surrounding inflammatory fat stranding measuring 4 mm. The common bile duct is dilated and there is intrahepatic bile duct dilatation. The CBD measures up to 1.4 cm. Within the distal common bile duct there is Altagracia Rone large stone which measures 1.5 x 1.1 cm, image 17/3. Fluid signal intensity. SPLEEN: Unremarkable. PANCREAS/PANCREATIC DUCT: Visualized pancreas is unremarkable. No significant main duct dilatation. No pancreatic mass identified. No signs of pseudocyst or pancreatic necrosis. ADRENAL GLANDS: Unremarkable. KIDNEYS: Bosniak class I left kidney cyst measures 7 mm, image 21/4. LYMPH NODES: No enlarged abdominal lymph nodes. VASCULATURE: Unremarkable. PERITONEUM: No ascites. ABDOMINAL WALL: No hernia. No mass. 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 Grossly unremarkable. No bowel obstruction. BONES: No acute abnormality or worrisome osseous lesion. SOFT TISSUES: Unremarkable. LUNGS: Atelectasis or consolidation noted within the right lung base. MISCELLANEOUS: The most likely differential  diagnosis based on these findings is choledocholithiasis with secondary cholangitis and/or pancreatitis. Follow-up guidelines would typically involve endoscopic retrograde cholangiopancreatography (ERCP) for stone extraction and/or cholecystectomy. IMPRESSION: 1. Obstructing distal choledocholithiasis with intrahepatic and extrahepatic biliary dilatation (CBD up to 1.4 cm), most consistent with gallstone pancreatitis in the setting of elevated lipase. 2. Gallbladder wall thickening (4 mm) without pericholecystic inflammatory fat stranding, which may be reactive. 3. No pancreatic mass, pseudocyst, necrosis, or significant main pancreatic duct dilatation. Electronically signed by: Waddell Calk MD 02/01/2024 06:49 AM EST RP Workstation: [id number]   US  Abdomen Limited RUQ (LIVER/GB) Result Date: 02/01/2024 EXAM: Right Upper Quadrant Abdominal Ultrasound 01/31/2024 11:58:00 PM TECHNIQUE: Real-time ultrasonography of the right upper quadrant of the abdomen was performed. COMPARISON: CT abdomen and pelvis 07/07/2023. CLINICAL HISTORY: Abdominal pain. FINDINGS: LIVER: Increased echogenicity throughout the liver. No intrahepatic biliary ductal dilatation. No evidence of mass. Hepatopetal flow in the portal vein. BILIARY SYSTEM: Gallbladder wall is mildly thickened measuring 3.8 mm. Sonographic Beverley sign is negative. No cholelithiasis. Common bile duct measures 6.7 mm. RIGHT KIDNEY: No hydronephrosis. No echogenic calculi. No mass. PANCREAS: Visualized portions of the pancreas are  unremarkable. OTHER: No right upper quadrant ascites. IMPRESSION: 1. Mildly thickened gallbladder wall measuring 3.8 mm with negative sonographic Murphy sign for, nonspecific. 2. Increased echogenicity throughout the liver compatible with the facet of the osseous disease such as fatty infiltration . 3. Common bile duct measures 6.7 mm, borderline dilated. Recommend correlation with lab values. Electronically signed by: Greig Pique MD  02/01/2024 12:25 AM EST RP Workstation: [id number]        Scheduled Meds: Continuous Infusions:  sodium chloride  100 mL/hr at 02/01/24 0409   cefTRIAXone (ROCEPHIN)  IV     metronidazole       LOS: 0 days    Time spent: over 30 min     Meliton Monte, MD [facility]   To contact the attending provider between 7A-7P or the covering provider during after hours 7P-7A, please log into the web site [email or web address] and access using universal [place] password for that web site. If you do not have the password, please call the hospital operator.  02/01/2024, 8:04 AM    "

## 2024-02-02 LAB — CBC WITH DIFFERENTIAL/PLATELET
Abs Immature Granulocytes: 0.03 K/uL (ref 0.00–0.07)
Basophils Absolute: 0 K/uL (ref 0.0–0.1)
Basophils Relative: 0 %
Eosinophils Absolute: 0.2 K/uL (ref 0.0–0.5)
Eosinophils Relative: 2 %
HCT: 39 % (ref 39.0–52.0)
Hemoglobin: 13.9 g/dL (ref 13.0–17.0)
Immature Granulocytes: 0 %
Lymphocytes Relative: 35 %
Lymphs Abs: 2.4 K/uL (ref 0.7–4.0)
MCH: 30.5 pg (ref 26.0–34.0)
MCHC: 35.6 g/dL (ref 30.0–36.0)
MCV: 85.5 fL (ref 80.0–100.0)
Monocytes Absolute: 0.5 K/uL (ref 0.1–1.0)
Monocytes Relative: 7 %
Neutro Abs: 3.8 K/uL (ref 1.7–7.7)
Neutrophils Relative %: 56 %
Platelets: 147 K/uL — ABNORMAL LOW (ref 150–400)
RBC: 4.56 MIL/uL (ref 4.22–5.81)
RDW: 13.3 % (ref 11.5–15.5)
WBC: 7 K/uL (ref 4.0–10.5)
nRBC: 0 % (ref 0.0–0.2)

## 2024-02-02 LAB — COMPREHENSIVE METABOLIC PANEL WITH GFR
ALT: 101 U/L — ABNORMAL HIGH (ref 0–44)
AST: 30 U/L (ref 15–41)
Albumin: 3 g/dL — ABNORMAL LOW (ref 3.5–5.0)
Alkaline Phosphatase: 326 U/L — ABNORMAL HIGH (ref 38–126)
Anion gap: 9 (ref 5–15)
BUN: 17 mg/dL (ref 8–23)
CO2: 23 mmol/L (ref 22–32)
Calcium: 9.2 mg/dL (ref 8.9–10.3)
Chloride: 102 mmol/L (ref 98–111)
Creatinine, Ser: 0.95 mg/dL (ref 0.61–1.24)
GFR, Estimated: >60 mL/min
Glucose, Bld: 86 mg/dL (ref 70–99)
Potassium: 4.2 mmol/L (ref 3.5–5.1)
Sodium: 134 mmol/L — ABNORMAL LOW (ref 135–145)
Total Bilirubin: 0.5 mg/dL (ref 0.0–1.2)
Total Protein: 5.4 g/dL — ABNORMAL LOW (ref 6.5–8.1)

## 2024-02-02 LAB — LIPASE, BLOOD: Lipase: 1193 U/L — ABNORMAL HIGH (ref 11–51)

## 2024-02-02 MED ORDER — LACTATED RINGERS IV SOLN: INTRAVENOUS | Status: AC

## 2024-02-02 MED ORDER — KETOROLAC TROMETHAMINE 15 MG/ML IJ SOLN
7.5000 mg | Freq: Four times a day (QID) | INTRAMUSCULAR | Status: DC | PRN
  Administered 2024-02-02: 7.5 mg via INTRAVENOUS
  Filled 2024-02-02 (×2): qty 1

## 2024-02-02 MED ORDER — IBUPROFEN 200 MG PO TABS
200.0000 mg | ORAL_TABLET | Freq: Four times a day (QID) | ORAL | Status: DC | PRN
  Administered 2024-02-03: 200 mg via ORAL
  Filled 2024-02-02: qty 1

## 2024-02-02 MED ORDER — PANTOPRAZOLE SODIUM 40 MG PO TBEC
40.0000 mg | DELAYED_RELEASE_TABLET | Freq: Every day | ORAL | Status: DC
  Filled 2024-02-02: qty 1

## 2024-02-02 NOTE — Progress Notes (Signed)
 Pt wheeled to [facility]. Chart given. Fluids were done.

## 2024-02-02 NOTE — H&P (View-Only) (Signed)
 [facility] Progress Note  SUBJECTIVE:   Interval history: Eddie [name] was seen and evaluated today at bedside.  Resting comfortably in bed, spouse at bedside.  At length discussion with patient regarding choledocholithiasis, duodenal diverticulum, answered all questions about ERCP planned for tomorrow.  Having intermittent epigastric abdominal pain.  No chest pain or shortness of breath.  No nausea or vomiting, tolerating clear liquids, hopeful to try low-fat diet this evening before being n.p.o. for procedure tomorrow.  Past Medical History:  Diagnosis Date   Diverticulitis    Hypertension    Past Surgical History:  Procedure Laterality Date   HERNIA REPAIR     LAPAROSCOPIC APPENDECTOMY N/A 03/01/2013   Procedure: APPENDECTOMY LAPAROSCOPIC;  Surgeon: Oneil DELENA Budge, MD;  Location: [facility];  Service: General;  Laterality: N/A;   Current Facility-Administered Medications  Medication Dose Route Frequency Provider Last Rate Last Admin   cefTRIAXone (ROCEPHIN) 2 g in sodium chloride  0.9 % 100 mL IVPB  2 g Intravenous Q24H Emokpae, Ejiroghene E, MD 200 mL/hr at 02/01/24 2114 2 g at 02/01/24 2114   HYDROmorphone  (DILAUDID ) injection 0.5 mg  0.5 mg Intravenous Q4H PRN Emokpae, Ejiroghene E, MD   0.5 mg at 02/02/24 9641   lactated ringers  infusion   Intravenous Continuous Perri DELENA Meliton Mickey., MD 125 mL/hr at 02/02/24 1049 New Bag at 02/02/24 1049   LORazepam (ATIVAN) tablet 1-4 mg  1-4 mg Oral Q1H PRN Perri DELENA Meliton Mickey., MD       Or   LORazepam (ATIVAN) injection 1-4 mg  1-4 mg Intravenous Q1H PRN Perri DELENA Meliton Mickey., MD       LORazepam (ATIVAN) tablet 0-4 mg  0-4 mg Oral Q6H Perri DELENA Meliton Mickey., MD       Followed by   NOREEN ON 02/03/2024] LORazepam (ATIVAN) tablet 0-4 mg  0-4 mg Oral Q12H Perri DELENA Meliton Mickey., MD       metroNIDAZOLE (FLAGYL) IVPB 500 mg  500 mg Intravenous Q12H Emokpae, Ejiroghene E, MD 100 mL/hr at 02/02/24 1342 500 mg at 02/02/24 1342    multivitamin with minerals tablet 1 tablet  1 tablet Oral Daily Perri DELENA Meliton Mickey., MD   1 tablet at 02/02/24 1049   ondansetron  (ZOFRAN ) tablet 4 mg  4 mg Oral Q6H PRN Emokpae, Ejiroghene E, MD       Or   ondansetron  (ZOFRAN ) injection 4 mg  4 mg Intravenous Q6H PRN Emokpae, Ejiroghene E, MD       polyethylene glycol (MIRALAX / GLYCOLAX) packet 17 g  17 g Oral Daily PRN Emokpae, Ejiroghene E, MD       Allergies as of 01/31/2024   (No Known Allergies)   Review of Systems:  Review of Systems  Respiratory:  Negative for shortness of breath.   Cardiovascular:  Negative for chest pain.  Gastrointestinal:  Positive for abdominal pain. Negative for nausea and vomiting.    OBJECTIVE:   Temp:  [97.8 F (36.6 C)-98.6 F (37 C)] 97.8 F (36.6 C) (01/02 0850) Pulse Rate:  [44-51] 49 (01/02 0850) Resp:  [18] 18 (01/02 0850) BP: (98-113)/(56-74) 99/56 (01/02 0850) SpO2:  [93 %-100 %] 97 % (01/02 0850) Weight:  [92.3 kg] 92.3 kg (01/02 0403) Last BM Date : 01/31/24 Physical Exam Constitutional:      General: He is not in acute distress.    Appearance: He is not ill-appearing, toxic-appearing or diaphoretic.  Cardiovascular:     Rate and Rhythm: Normal rate and regular rhythm.  Pulmonary:     Effort: No respiratory distress.     Breath sounds: Normal breath sounds.  Abdominal:     General: Bowel sounds are normal. There is no distension.     Palpations: Abdomen is soft.     Tenderness: There is abdominal tenderness.  Neurological:     Mental Status: He is alert.     Labs: Recent Labs    01/31/24 2041 02/01/24 0410 02/02/24 0650  WBC 17.4* 13.2* 7.0  HGB 16.1 15.3 13.9  HCT 45.3 42.8 39.0  PLT 170 149* 147*   BMET Recent Labs    01/31/24 2041 02/01/24 0410 02/02/24 0650  NA 127* 129* 134*  K 4.9 4.6 4.2  CL 93* 95* 102  CO2 22 22 23   GLUCOSE 128* 97 86  BUN 32* 31* 17  CREATININE 2.41* 1.93* 0.95  CALCIUM 10.2 9.6 9.2   LFT Recent Labs    02/02/24 0650   PROT 5.4*  ALBUMIN 3.0*  AST 30  ALT 101*  ALKPHOS 326*  BILITOT 0.5   PT/INR No results for input(s): LABPROT, INR in the last 72 hours. Diagnostic imaging: MR ABDOMEN MRCP W WO CONTRAST Result Date: 02/01/2024 EXAM: MRCP WITH AND WITHOUT IV CONTRAST 02/01/2024 06:16:04 AM TECHNIQUE: Multisequence, multiplanar magnetic resonance images of the abdomen with and without intravenous contrast. MRCP sequences were performed. 10 mL gadobutrol (GADAVIST) 1 MMOL/ML injection. COMPARISON: CT abdomen and pelvis 07/07/2023. CLINICAL HISTORY: The patient reports experiencing upper abdominal pain and back pain intermittently for 2 months. The patient endorses vomiting. Abnormal elevated lipase of 1693. FINDINGS: LIVER: Equivocal hepatic steatosis. No suspicious enhancing liver lesion. GALLBLADDER AND BILIARY SYSTEM: Gallbladder wall thickening without surrounding inflammatory fat stranding measuring 4 mm. The common bile duct is dilated and there is intrahepatic bile duct dilatation. The CBD measures up to 1.4 cm. Within the distal common bile duct there is a large stone which measures 1.5 x 1.1 cm, image 17/3. Fluid signal intensity. SPLEEN: Unremarkable. PANCREAS/PANCREATIC DUCT: Visualized pancreas is unremarkable. No significant main duct dilatation. No pancreatic mass identified. No signs of pseudocyst or pancreatic necrosis. ADRENAL GLANDS: Unremarkable. KIDNEYS: Bosniak class I left kidney cyst measures 7 mm, image 21/4. LYMPH NODES: No enlarged abdominal lymph nodes. VASCULATURE: Unremarkable. PERITONEUM: No ascites. ABDOMINAL WALL: No hernia. No mass. 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 Grossly unremarkable. No bowel obstruction. BONES: No acute abnormality or worrisome osseous lesion. SOFT TISSUES: Unremarkable. LUNGS: Atelectasis or consolidation noted within the right lung base. MISCELLANEOUS: The most likely differential diagnosis  based on these findings is choledocholithiasis with secondary cholangitis and/or pancreatitis. Follow-up guidelines would typically involve endoscopic retrograde cholangiopancreatography (ERCP) for stone extraction and/or cholecystectomy. IMPRESSION: 1. Obstructing distal choledocholithiasis with intrahepatic and extrahepatic biliary dilatation (CBD up to 1.4 cm), most consistent with gallstone pancreatitis in the setting of elevated lipase. 2. Gallbladder wall thickening (4 mm) without pericholecystic inflammatory fat stranding, which may be reactive. 3. No pancreatic mass, pseudocyst, necrosis, or significant main pancreatic duct dilatation. Electronically signed by: Waddell Calk MD 02/01/2024 06:49 AM EST RP Workstation: [id number]   MR 3D Recon At Scanner Result Date: 02/01/2024 EXAM: MRCP WITH AND WITHOUT IV CONTRAST 02/01/2024 06:16:04 AM TECHNIQUE: Multisequence, multiplanar magnetic resonance images of the abdomen with and without intravenous contrast. MRCP sequences were performed. 10 mL gadobutrol (GADAVIST) 1 MMOL/ML injection. COMPARISON: CT abdomen and pelvis 07/07/2023. CLINICAL HISTORY: The patient reports experiencing upper abdominal pain and back pain intermittently for  2 months. The patient endorses vomiting. Abnormal elevated lipase of 1693. FINDINGS: LIVER: Equivocal hepatic steatosis. No suspicious enhancing liver lesion. GALLBLADDER AND BILIARY SYSTEM: Gallbladder wall thickening without surrounding inflammatory fat stranding measuring 4 mm. The common bile duct is dilated and there is intrahepatic bile duct dilatation. The CBD measures up to 1.4 cm. Within the distal common bile duct there is a large stone which measures 1.5 x 1.1 cm, image 17/3. Fluid signal intensity. SPLEEN: Unremarkable. PANCREAS/PANCREATIC DUCT: Visualized pancreas is unremarkable. No significant main duct dilatation. No pancreatic mass identified. No signs of pseudocyst or pancreatic necrosis. ADRENAL GLANDS:  Unremarkable. KIDNEYS: Bosniak class I left kidney cyst measures 7 mm, image 21/4. LYMPH NODES: No enlarged abdominal lymph nodes. VASCULATURE: Unremarkable. PERITONEUM: No ascites. ABDOMINAL WALL: No hernia. No mass. 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 Grossly unremarkable. No bowel obstruction. BONES: No acute abnormality or worrisome osseous lesion. SOFT TISSUES: Unremarkable. LUNGS: Atelectasis or consolidation noted within the right lung base. MISCELLANEOUS: The most likely differential diagnosis based on these findings is choledocholithiasis with secondary cholangitis and/or pancreatitis. Follow-up guidelines would typically involve endoscopic retrograde cholangiopancreatography (ERCP) for stone extraction and/or cholecystectomy. IMPRESSION: 1. Obstructing distal choledocholithiasis with intrahepatic and extrahepatic biliary dilatation (CBD up to 1.4 cm), most consistent with gallstone pancreatitis in the setting of elevated lipase. 2. Gallbladder wall thickening (4 mm) without pericholecystic inflammatory fat stranding, which may be reactive. 3. No pancreatic mass, pseudocyst, necrosis, or significant main pancreatic duct dilatation. Electronically signed by: Waddell Calk MD 02/01/2024 06:49 AM EST RP Workstation: [id number]   US  Abdomen Limited RUQ (LIVER/GB) Result Date: 02/01/2024 EXAM: Right Upper Quadrant Abdominal Ultrasound 01/31/2024 11:58:00 PM TECHNIQUE: Real-time ultrasonography of the right upper quadrant of the abdomen was performed. COMPARISON: CT abdomen and pelvis 07/07/2023. CLINICAL HISTORY: Abdominal pain. FINDINGS: LIVER: Increased echogenicity throughout the liver. No intrahepatic biliary ductal dilatation. No evidence of mass. Hepatopetal flow in the portal vein. BILIARY SYSTEM: Gallbladder wall is mildly thickened measuring 3.8 mm. Sonographic Beverley sign is negative. No cholelithiasis. Common bile duct measures  6.7 mm. RIGHT KIDNEY: No hydronephrosis. No echogenic calculi. No mass. PANCREAS: Visualized portions of the pancreas are unremarkable. OTHER: No right upper quadrant ascites. IMPRESSION: 1. Mildly thickened gallbladder wall measuring 3.8 mm with negative sonographic Murphy sign for, nonspecific. 2. Increased echogenicity throughout the liver compatible with the facet of the osseous disease such as fatty infiltration . 3. Common bile duct measures 6.7 mm, borderline dilated. Recommend correlation with lab values. Electronically signed by: Greig Pique MD 02/01/2024 12:25 AM EST RP Workstation: [id number]   IMPRESSION: Acute gallstone pancreatitis (elevated lipase, abdominal pain) Choledocholithiasis Upper abdominal pain secondary to above Leukocytosis, resolved Transaminase elevation in mixed pattern Hypertension  PLAN: -Plan for ERCP tomorrow with Dr. Wilhelmenia, discussed risks of bleeding/infection/perforation/missed lesion/anesthesia/pancreatitis, patient and his spouse verbalized understanding -Low-fat diet as tolerated today, n.p.o. at midnight -Pain medication per primary team -Maintenance IV fluids -[facility] will follow   LOS: 1 day   Estefana Keas, [facility]

## 2024-02-02 NOTE — TOC CM/SW Note (Signed)
 Transition of Care [facility]) - Inpatient Brief Assessment   Patient Details  Name: Eddie [name] MRN: [id number] Date of Birth: August 21, 1960  Transition of Care [facility]) CM/SW Contact:    Lauraine FORBES Saa, LCSWA Phone Number: 02/02/2024, 3:39 PM   Clinical Narrative:  3:39 PM Per chart review, patient resides at home. Patient has a PCP and insurance on file. Patient does not have SNF/HH/DME history on chart. Patient's listed preferred pharmacy is [id number] . TOC consult was placed for substance use counseling/education. 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 CSW added substance use resources to patient's AVS in case of interest. No other TOC needs identified at this time. TOC will continue to follow.  Transition of Care Asessment: Insurance and Status: Insurance coverage has been reviewed Patient has primary care physician: Yes Home environment has been reviewed: Private Residence Prior level of function:: N/A Prior/Current Home Services: No current home services Social Drivers of Health Review: SDOH reviewed no interventions necessary Readmission risk has been reviewed: Yes (Currently Green 7%) Transition of care needs: no transition of care needs at this time

## 2024-02-02 NOTE — Progress Notes (Addendum)
 Returned call  for report to [facility] nurse unavailable at this time.

## 2024-02-02 NOTE — Progress Notes (Signed)
 [facility] Progress Note  SUBJECTIVE:   Interval history: Eddie [name] was seen and evaluated today at bedside.  Resting comfortably in bed, spouse at bedside.  At length discussion with patient regarding choledocholithiasis, duodenal diverticulum, answered all questions about ERCP planned for tomorrow.  Having intermittent epigastric abdominal pain.  No chest pain or shortness of breath.  No nausea or vomiting, tolerating clear liquids, hopeful to try low-fat diet this evening before being n.p.o. for procedure tomorrow.  Past Medical History:  Diagnosis Date   Diverticulitis    Hypertension    Past Surgical History:  Procedure Laterality Date   HERNIA REPAIR     LAPAROSCOPIC APPENDECTOMY N/A 03/01/2013   Procedure: APPENDECTOMY LAPAROSCOPIC;  Surgeon: Oneil DELENA Budge, MD;  Location: [facility];  Service: General;  Laterality: N/A;   Current Facility-Administered Medications  Medication Dose Route Frequency Provider Last Rate Last Admin   cefTRIAXone (ROCEPHIN) 2 g in sodium chloride  0.9 % 100 mL IVPB  2 g Intravenous Q24H Emokpae, Ejiroghene E, MD 200 mL/hr at 02/01/24 2114 2 g at 02/01/24 2114   HYDROmorphone  (DILAUDID ) injection 0.5 mg  0.5 mg Intravenous Q4H PRN Emokpae, Ejiroghene E, MD   0.5 mg at 02/02/24 9641   lactated ringers  infusion   Intravenous Continuous Perri DELENA Meliton Mickey., MD 125 mL/hr at 02/02/24 1049 New Bag at 02/02/24 1049   LORazepam (ATIVAN) tablet 1-4 mg  1-4 mg Oral Q1H PRN Perri DELENA Meliton Mickey., MD       Or   LORazepam (ATIVAN) injection 1-4 mg  1-4 mg Intravenous Q1H PRN Perri DELENA Meliton Mickey., MD       LORazepam (ATIVAN) tablet 0-4 mg  0-4 mg Oral Q6H Perri DELENA Meliton Mickey., MD       Followed by   NOREEN ON 02/03/2024] LORazepam (ATIVAN) tablet 0-4 mg  0-4 mg Oral Q12H Perri DELENA Meliton Mickey., MD       metroNIDAZOLE (FLAGYL) IVPB 500 mg  500 mg Intravenous Q12H Emokpae, Ejiroghene E, MD 100 mL/hr at 02/02/24 1342 500 mg at 02/02/24 1342    multivitamin with minerals tablet 1 tablet  1 tablet Oral Daily Perri DELENA Meliton Mickey., MD   1 tablet at 02/02/24 1049   ondansetron  (ZOFRAN ) tablet 4 mg  4 mg Oral Q6H PRN Emokpae, Ejiroghene E, MD       Or   ondansetron  (ZOFRAN ) injection 4 mg  4 mg Intravenous Q6H PRN Emokpae, Ejiroghene E, MD       polyethylene glycol (MIRALAX / GLYCOLAX) packet 17 g  17 g Oral Daily PRN Emokpae, Ejiroghene E, MD       Allergies as of 01/31/2024   (No Known Allergies)   Review of Systems:  Review of Systems  Respiratory:  Negative for shortness of breath.   Cardiovascular:  Negative for chest pain.  Gastrointestinal:  Positive for abdominal pain. Negative for nausea and vomiting.    OBJECTIVE:   Temp:  [97.8 F (36.6 C)-98.6 F (37 C)] 97.8 F (36.6 C) (01/02 0850) Pulse Rate:  [44-51] 49 (01/02 0850) Resp:  [18] 18 (01/02 0850) BP: (98-113)/(56-74) 99/56 (01/02 0850) SpO2:  [93 %-100 %] 97 % (01/02 0850) Weight:  [92.3 kg] 92.3 kg (01/02 0403) Last BM Date : 01/31/24 Physical Exam Constitutional:      General: He is not in acute distress.    Appearance: He is not ill-appearing, toxic-appearing or diaphoretic.  Cardiovascular:     Rate and Rhythm: Normal rate and regular rhythm.  Pulmonary:     Effort: No respiratory distress.     Breath sounds: Normal breath sounds.  Abdominal:     General: Bowel sounds are normal. There is no distension.     Palpations: Abdomen is soft.     Tenderness: There is abdominal tenderness.  Neurological:     Mental Status: He is alert.     Labs: Recent Labs    01/31/24 2041 02/01/24 0410 02/02/24 0650  WBC 17.4* 13.2* 7.0  HGB 16.1 15.3 13.9  HCT 45.3 42.8 39.0  PLT 170 149* 147*   BMET Recent Labs    01/31/24 2041 02/01/24 0410 02/02/24 0650  NA 127* 129* 134*  K 4.9 4.6 4.2  CL 93* 95* 102  CO2 22 22 23   GLUCOSE 128* 97 86  BUN 32* 31* 17  CREATININE 2.41* 1.93* 0.95  CALCIUM 10.2 9.6 9.2   LFT Recent Labs    02/02/24 0650   PROT 5.4*  ALBUMIN 3.0*  AST 30  ALT 101*  ALKPHOS 326*  BILITOT 0.5   PT/INR No results for input(s): LABPROT, INR in the last 72 hours. Diagnostic imaging: MR ABDOMEN MRCP W WO CONTRAST Result Date: 02/01/2024 EXAM: MRCP WITH AND WITHOUT IV CONTRAST 02/01/2024 06:16:04 AM TECHNIQUE: Multisequence, multiplanar magnetic resonance images of the abdomen with and without intravenous contrast. MRCP sequences were performed. 10 mL gadobutrol (GADAVIST) 1 MMOL/ML injection. COMPARISON: CT abdomen and pelvis 07/07/2023. CLINICAL HISTORY: The patient reports experiencing upper abdominal pain and back pain intermittently for 2 months. The patient endorses vomiting. Abnormal elevated lipase of 1693. FINDINGS: LIVER: Equivocal hepatic steatosis. No suspicious enhancing liver lesion. GALLBLADDER AND BILIARY SYSTEM: Gallbladder wall thickening without surrounding inflammatory fat stranding measuring 4 mm. The common bile duct is dilated and there is intrahepatic bile duct dilatation. The CBD measures up to 1.4 cm. Within the distal common bile duct there is a large stone which measures 1.5 x 1.1 cm, image 17/3. Fluid signal intensity. SPLEEN: Unremarkable. PANCREAS/PANCREATIC DUCT: Visualized pancreas is unremarkable. No significant main duct dilatation. No pancreatic mass identified. No signs of pseudocyst or pancreatic necrosis. ADRENAL GLANDS: Unremarkable. KIDNEYS: Bosniak class I left kidney cyst measures 7 mm, image 21/4. LYMPH NODES: No enlarged abdominal lymph nodes. VASCULATURE: Unremarkable. PERITONEUM: No ascites. ABDOMINAL WALL: No hernia. No mass. 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 Grossly unremarkable. No bowel obstruction. BONES: No acute abnormality or worrisome osseous lesion. SOFT TISSUES: Unremarkable. LUNGS: Atelectasis or consolidation noted within the right lung base. MISCELLANEOUS: The most likely differential diagnosis  based on these findings is choledocholithiasis with secondary cholangitis and/or pancreatitis. Follow-up guidelines would typically involve endoscopic retrograde cholangiopancreatography (ERCP) for stone extraction and/or cholecystectomy. IMPRESSION: 1. Obstructing distal choledocholithiasis with intrahepatic and extrahepatic biliary dilatation (CBD up to 1.4 cm), most consistent with gallstone pancreatitis in the setting of elevated lipase. 2. Gallbladder wall thickening (4 mm) without pericholecystic inflammatory fat stranding, which may be reactive. 3. No pancreatic mass, pseudocyst, necrosis, or significant main pancreatic duct dilatation. Electronically signed by: Waddell Calk MD 02/01/2024 06:49 AM EST RP Workstation: [id number]   MR 3D Recon At Scanner Result Date: 02/01/2024 EXAM: MRCP WITH AND WITHOUT IV CONTRAST 02/01/2024 06:16:04 AM TECHNIQUE: Multisequence, multiplanar magnetic resonance images of the abdomen with and without intravenous contrast. MRCP sequences were performed. 10 mL gadobutrol (GADAVIST) 1 MMOL/ML injection. COMPARISON: CT abdomen and pelvis 07/07/2023. CLINICAL HISTORY: The patient reports experiencing upper abdominal pain and back pain intermittently for  2 months. The patient endorses vomiting. Abnormal elevated lipase of 1693. FINDINGS: LIVER: Equivocal hepatic steatosis. No suspicious enhancing liver lesion. GALLBLADDER AND BILIARY SYSTEM: Gallbladder wall thickening without surrounding inflammatory fat stranding measuring 4 mm. The common bile duct is dilated and there is intrahepatic bile duct dilatation. The CBD measures up to 1.4 cm. Within the distal common bile duct there is a large stone which measures 1.5 x 1.1 cm, image 17/3. Fluid signal intensity. SPLEEN: Unremarkable. PANCREAS/PANCREATIC DUCT: Visualized pancreas is unremarkable. No significant main duct dilatation. No pancreatic mass identified. No signs of pseudocyst or pancreatic necrosis. ADRENAL GLANDS:  Unremarkable. KIDNEYS: Bosniak class I left kidney cyst measures 7 mm, image 21/4. LYMPH NODES: No enlarged abdominal lymph nodes. VASCULATURE: Unremarkable. PERITONEUM: No ascites. ABDOMINAL WALL: No hernia. No mass. 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 Grossly unremarkable. No bowel obstruction. BONES: No acute abnormality or worrisome osseous lesion. SOFT TISSUES: Unremarkable. LUNGS: Atelectasis or consolidation noted within the right lung base. MISCELLANEOUS: The most likely differential diagnosis based on these findings is choledocholithiasis with secondary cholangitis and/or pancreatitis. Follow-up guidelines would typically involve endoscopic retrograde cholangiopancreatography (ERCP) for stone extraction and/or cholecystectomy. IMPRESSION: 1. Obstructing distal choledocholithiasis with intrahepatic and extrahepatic biliary dilatation (CBD up to 1.4 cm), most consistent with gallstone pancreatitis in the setting of elevated lipase. 2. Gallbladder wall thickening (4 mm) without pericholecystic inflammatory fat stranding, which may be reactive. 3. No pancreatic mass, pseudocyst, necrosis, or significant main pancreatic duct dilatation. Electronically signed by: Waddell Calk MD 02/01/2024 06:49 AM EST RP Workstation: [id number]   US  Abdomen Limited RUQ (LIVER/GB) Result Date: 02/01/2024 EXAM: Right Upper Quadrant Abdominal Ultrasound 01/31/2024 11:58:00 PM TECHNIQUE: Real-time ultrasonography of the right upper quadrant of the abdomen was performed. COMPARISON: CT abdomen and pelvis 07/07/2023. CLINICAL HISTORY: Abdominal pain. FINDINGS: LIVER: Increased echogenicity throughout the liver. No intrahepatic biliary ductal dilatation. No evidence of mass. Hepatopetal flow in the portal vein. BILIARY SYSTEM: Gallbladder wall is mildly thickened measuring 3.8 mm. Sonographic Beverley sign is negative. No cholelithiasis. Common bile duct measures  6.7 mm. RIGHT KIDNEY: No hydronephrosis. No echogenic calculi. No mass. PANCREAS: Visualized portions of the pancreas are unremarkable. OTHER: No right upper quadrant ascites. IMPRESSION: 1. Mildly thickened gallbladder wall measuring 3.8 mm with negative sonographic Murphy sign for, nonspecific. 2. Increased echogenicity throughout the liver compatible with the facet of the osseous disease such as fatty infiltration . 3. Common bile duct measures 6.7 mm, borderline dilated. Recommend correlation with lab values. Electronically signed by: Greig Pique MD 02/01/2024 12:25 AM EST RP Workstation: [id number]   IMPRESSION: Acute gallstone pancreatitis (elevated lipase, abdominal pain) Choledocholithiasis Upper abdominal pain secondary to above Leukocytosis, resolved Transaminase elevation in mixed pattern Hypertension  PLAN: -Plan for ERCP tomorrow with Dr. Wilhelmenia, discussed risks of bleeding/infection/perforation/missed lesion/anesthesia/pancreatitis, patient and his spouse verbalized understanding -Low-fat diet as tolerated today, n.p.o. at midnight -Pain medication per primary team -Maintenance IV fluids -[facility] will follow   LOS: 1 day   Estefana Keas, [facility]

## 2024-02-02 NOTE — Progress Notes (Signed)
" °   02/02/24 0851  Assess: MEWS Score  Temp 97.8 F (36.6 C)  BP (!) 101/58  Pulse Rate (!) 50  Resp 18  Level of Consciousness Alert  SpO2 98 %  O2 Device Room Air  Assess: MEWS Score  MEWS Temp 0  MEWS Systolic 0  MEWS Pulse 1  MEWS RR 0  MEWS LOC 0  MEWS Score 1  MEWS Score Color Green  Assess: SIRS CRITERIA  SIRS Temperature  0  SIRS Respirations  0  SIRS Pulse 0  SIRS WBC 0  SIRS Score Sum  0   Rechecked vitals. "

## 2024-02-02 NOTE — Progress Notes (Signed)
 This Rn tried calling report to [id number]. No answer. Will call again. Pt being transferred to tele floor due to low heart rate in 40s-low 50' consistently

## 2024-02-02 NOTE — Progress Notes (Signed)
 " PROGRESS NOTE    Eddie [name]  FMW:[id number] DOB: [birth year] DOA: 01/31/2024 PCP: [facility] Complaint  Patient presents with   Abdominal Pain    Brief Narrative:   Eddie [name] is Eddie [name] 64 y.o. male with medical history significant for diverticulosis, hypertension who presented with acute on chronic abdominal pain, ongoing for weeks, which worsened leading to him presenting to the hospital.    He's been admitted with gallstone pancreatitis.   Assessment & Plan:   Principal Problem:   Acute pancreatitis Active Problems:   Elevated liver enzymes   Essential hypertension   Alcohol abuse   Diverticulosis   AKI (acute kidney injury)  Gallstone Pancreatitis Choledocholithiasis  MRCP with obstructing choledocholithiasis with intrahepatic and extrahepatic biliary dilatation LFT's continues to improved today - alk phos, AST, ALT remain elevated.  Bili normalized. Will continue abx for now GI c/s, appreciate recs Will need surgery c/s for cholecystectomy after ERCP Continue IVF  Strict I/O, daily weights  Systemic Inflammatory Response Syndrome Hypothermia, leukocytosis at presentation  Possible sign of infection in setting of above Abx as above to cover possible cholangitis  Acute Kidney Injury Improving with IVF UA with 0-5 RBC, 30 mg/dl protein - 88-79 WBC resolved  Bradycardia Follow EKG  HTN Holding amlodipine, benicar for now - BP now on low side, follow with IVF  Hyponatremia Improving with IVF, at least partially related to hypovolemia  Dyslipidemia Holding crestor   Etoh Abuse Patient reports last drink 1 week ago CIWA  GERD PPI    DVT prophylaxis: SCD Code Status: full Family Communication: none Disposition:   Status is: Inpatient Remains inpatient appropriate because: need for continued inpatient care   Consultants:  GI, magod  Procedures:  none  Antimicrobials:  Anti-infectives (From admission,  onward)    Start     Dose/Rate Route Frequency Ordered Stop   02/01/24 2200  cefTRIAXone (ROCEPHIN) 2 g in sodium chloride  0.9 % 100 mL IVPB        2 g 200 mL/hr over 30 Minutes Intravenous Every 24 hours 02/01/24 0600     02/01/24 1400  metroNIDAZOLE (FLAGYL) IVPB 500 mg        500 mg 100 mL/hr over 60 Minutes Intravenous Every 12 hours 02/01/24 0600     02/01/24 0000  ciprofloxacin (CIPRO) IVPB 400 mg        400 mg 200 mL/hr over 60 Minutes Intravenous  Once 01/31/24 2355 02/01/24 0312   02/01/24 0000  metroNIDAZOLE (FLAGYL) IVPB 500 mg        500 mg 100 mL/hr over 60 Minutes Intravenous  Once 01/31/24 2355 02/01/24 0204       Subjective: Pain is better, asking what next steps will be  Objective: Vitals:   02/01/24 2322 02/02/24 0403 02/02/24 0419 02/02/24 0850  BP: 108/68  98/66 (!) 99/56  Pulse: (!) 44  (!) 45 (!) 49  Resp:    18  Temp: 98.4 F (36.9 C)  98.6 F (37 C) 97.8 F (36.6 C)  TempSrc: Oral  Oral Oral  SpO2: 95%  93% 97%  Weight:  92.3 kg      Intake/Output Summary (Last 24 hours) at 02/02/2024 1454 Last data filed at 02/02/2024 0400 Gross per 24 hour  Intake 2318.05 ml  Output --  Net 2318.05 ml   Filed Weights   02/02/24 0403  Weight: 92.3 kg    Examination:  General exam: Appears calm and comfortable  Respiratory system: unlabored Cardiovascular system: RRR Gastrointestinal system: Abdomen is nondistended, soft and nontender Central nervous system: Alert and oriented. No focal neurological deficits. Extremities: no [name]   Data Reviewed: I have personally reviewed following labs and imaging studies  CBC: Recent Labs  Lab 01/31/24 2041 02/01/24 0410 02/02/24 0650  WBC 17.4* 13.2* 7.0  NEUTROABS  --   --  3.8  HGB 16.1 15.3 13.9  HCT 45.3 42.8 39.0  MCV 87.1 85.8 85.5  PLT 170 149* 147*    Basic Metabolic Panel: Recent Labs  Lab 01/31/24 2041 02/01/24 0410 02/02/24 0650  NA 127* 129* 134*  K 4.9 4.6 4.2  CL 93* 95* 102  CO2  22 22 23   GLUCOSE 128* 97 86  BUN 32* 31* 17  CREATININE 2.41* 1.93* 0.95  CALCIUM 10.2 9.6 9.2    GFR: CrCl cannot be calculated (Unknown ideal weight.).  Liver Function Tests: Recent Labs  Lab 01/31/24 2041 02/01/24 0410 02/02/24 0650  AST 129* 89* 30  ALT 230* 185* 101*  ALKPHOS 570* 487* 326*  BILITOT 1.7* 0.8 0.5  PROT 6.9 6.4* 5.4*  ALBUMIN 3.7 3.3* 3.0*    CBG: No results for input(s): GLUCAP in the last 168 hours.   Recent Results (from the past 240 hours)  Blood culture (routine x 2)     Status: None (Preliminary result)   Collection Time: 01/31/24 11:57 PM   Specimen: BLOOD  Result Value Ref Range Status   Specimen Description BLOOD SITE NOT SPECIFIED  Final   Special Requests   Final    BOTTLES DRAWN AEROBIC AND ANAEROBIC Blood Culture adequate volume   Culture   Final    NO GROWTH 1 DAY Performed at [facility], [address], [place], [place] [address]    Report Status PENDING  Incomplete  Blood culture (routine x 2)     Status: None (Preliminary result)   Collection Time: 02/01/24 12:02 AM   Specimen: BLOOD  Result Value Ref Range Status   Specimen Description BLOOD RIGHT ANTECUBITAL  Final   Special Requests   Final    BOTTLES DRAWN AEROBIC AND ANAEROBIC Blood Culture adequate volume   Culture   Final    NO GROWTH 1 DAY Performed at [facility], [address] [place], [place] [address]    Report Status PENDING  Incomplete         Radiology Studies: MR ABDOMEN MRCP W WO CONTRAST Result Date: 02/01/2024 EXAM: MRCP WITH AND WITHOUT IV CONTRAST 02/01/2024 06:16:04 AM TECHNIQUE: Multisequence, multiplanar magnetic resonance images of the abdomen with and without intravenous contrast. MRCP sequences were performed. 10 mL gadobutrol (GADAVIST) 1 MMOL/ML injection. COMPARISON: CT abdomen and pelvis 07/07/2023. CLINICAL HISTORY: The patient reports experiencing upper abdominal pain and back pain intermittently for 2 months. The  patient endorses vomiting. Abnormal elevated lipase of 1693. FINDINGS: LIVER: Equivocal hepatic steatosis. No suspicious enhancing liver lesion. GALLBLADDER AND BILIARY SYSTEM: Gallbladder wall thickening without surrounding inflammatory fat stranding measuring 4 mm. The common bile duct is dilated and there is intrahepatic bile duct dilatation. The CBD measures up to 1.4 cm. Within the distal common bile duct there is Genevive Printup large stone which measures 1.5 x 1.1 cm, image 17/3. Fluid signal intensity. SPLEEN: Unremarkable. PANCREAS/PANCREATIC DUCT: Visualized pancreas is unremarkable. No significant main duct dilatation. No pancreatic mass identified. No signs of pseudocyst or pancreatic necrosis. ADRENAL GLANDS: Unremarkable. KIDNEYS: Bosniak class I left kidney cyst measures 7 mm, image 21/4.  LYMPH NODES: No enlarged abdominal lymph nodes. VASCULATURE: Unremarkable. PERITONEUM: No ascites. ABDOMINAL WALL: No hernia. No mass. 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 Grossly unremarkable. No bowel obstruction. BONES: No acute abnormality or worrisome osseous lesion. SOFT TISSUES: Unremarkable. LUNGS: Atelectasis or consolidation noted within the right lung base. MISCELLANEOUS: The most likely differential diagnosis based on these findings is choledocholithiasis with secondary cholangitis and/or pancreatitis. Follow-up guidelines would typically involve endoscopic retrograde cholangiopancreatography (ERCP) for stone extraction and/or cholecystectomy. IMPRESSION: 1. Obstructing distal choledocholithiasis with intrahepatic and extrahepatic biliary dilatation (CBD up to 1.4 cm), most consistent with gallstone pancreatitis in the setting of elevated lipase. 2. Gallbladder wall thickening (4 mm) without pericholecystic inflammatory fat stranding, which may be reactive. 3. No pancreatic mass, pseudocyst, necrosis, or significant main pancreatic duct dilatation.  Electronically signed by: Waddell Calk MD 02/01/2024 06:49 AM EST RP Workstation: [id number]   MR 3D Recon At Scanner Result Date: 02/01/2024 EXAM: MRCP WITH AND WITHOUT IV CONTRAST 02/01/2024 06:16:04 AM TECHNIQUE: Multisequence, multiplanar magnetic resonance images of the abdomen with and without intravenous contrast. MRCP sequences were performed. 10 mL gadobutrol (GADAVIST) 1 MMOL/ML injection. COMPARISON: CT abdomen and pelvis 07/07/2023. CLINICAL HISTORY: The patient reports experiencing upper abdominal pain and back pain intermittently for 2 months. The patient endorses vomiting. Abnormal elevated lipase of 1693. FINDINGS: LIVER: Equivocal hepatic steatosis. No suspicious enhancing liver lesion. GALLBLADDER AND BILIARY SYSTEM: Gallbladder wall thickening without surrounding inflammatory fat stranding measuring 4 mm. The common bile duct is dilated and there is intrahepatic bile duct dilatation. The CBD measures up to 1.4 cm. Within the distal common bile duct there is Fiorela Pelzer large stone which measures 1.5 x 1.1 cm, image 17/3. Fluid signal intensity. SPLEEN: Unremarkable. PANCREAS/PANCREATIC DUCT: Visualized pancreas is unremarkable. No significant main duct dilatation. No pancreatic mass identified. No signs of pseudocyst or pancreatic necrosis. ADRENAL GLANDS: Unremarkable. KIDNEYS: Bosniak class I left kidney cyst measures 7 mm, image 21/4. LYMPH NODES: No enlarged abdominal lymph nodes. VASCULATURE: Unremarkable. PERITONEUM: No ascites. ABDOMINAL WALL: No hernia. No mass. 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 Grossly unremarkable. No bowel obstruction. BONES: No acute abnormality or worrisome osseous lesion. SOFT TISSUES: Unremarkable. LUNGS: Atelectasis or consolidation noted within the right lung base. MISCELLANEOUS: The most likely differential diagnosis based on these findings is choledocholithiasis with secondary cholangitis and/or  pancreatitis. Follow-up guidelines would typically involve endoscopic retrograde cholangiopancreatography (ERCP) for stone extraction and/or cholecystectomy. IMPRESSION: 1. Obstructing distal choledocholithiasis with intrahepatic and extrahepatic biliary dilatation (CBD up to 1.4 cm), most consistent with gallstone pancreatitis in the setting of elevated lipase. 2. Gallbladder wall thickening (4 mm) without pericholecystic inflammatory fat stranding, which may be reactive. 3. No pancreatic mass, pseudocyst, necrosis, or significant main pancreatic duct dilatation. Electronically signed by: Waddell Calk MD 02/01/2024 06:49 AM EST RP Workstation: [id number]   US  Abdomen Limited RUQ (LIVER/GB) Result Date: 02/01/2024 EXAM: Right Upper Quadrant Abdominal Ultrasound 01/31/2024 11:58:00 PM TECHNIQUE: Real-time ultrasonography of the right upper quadrant of the abdomen was performed. COMPARISON: CT abdomen and pelvis 07/07/2023. CLINICAL HISTORY: Abdominal pain. FINDINGS: LIVER: Increased echogenicity throughout the liver. No intrahepatic biliary ductal dilatation. No evidence of mass. Hepatopetal flow in the portal vein. BILIARY SYSTEM: Gallbladder wall is mildly thickened measuring 3.8 mm. Sonographic Beverley sign is negative. No cholelithiasis. Common bile duct measures 6.7 mm. RIGHT KIDNEY: No hydronephrosis. No echogenic calculi. No mass. PANCREAS: Visualized portions of the pancreas  are unremarkable. OTHER: No right upper quadrant ascites. IMPRESSION: 1. Mildly thickened gallbladder wall measuring 3.8 mm with negative sonographic Murphy sign for, nonspecific. 2. Increased echogenicity throughout the liver compatible with the facet of the osseous disease such as fatty infiltration . 3. Common bile duct measures 6.7 mm, borderline dilated. Recommend correlation with lab values. Electronically signed by: Greig Pique MD 02/01/2024 12:25 AM EST RP Workstation: [id number]        Scheduled Meds:  LORazepam  0-4  mg Oral Q6H   Followed by   NOREEN ON 02/03/2024] LORazepam  0-4 mg Oral Q12H   multivitamin with minerals  1 tablet Oral Daily   Continuous Infusions:  cefTRIAXone (ROCEPHIN)  IV 2 g (02/01/24 2114)   lactated ringers  125 mL/hr at 02/02/24 1049   metronidazole 500 mg (02/02/24 1342)     LOS: 1 day    Time spent: over 30 min     Meliton Monte, MD [facility]   To contact the attending provider between 7A-7P or the covering provider during after hours 7P-7A, please log into the web site [email or web address] and access using universal  password for that web site. If you do not have the password, please call the hospital operator.  02/02/2024, 2:54 PM    "

## 2024-02-02 NOTE — Discharge Instructions (Signed)
 [employer] Guide Inpatient Behavioral Health/Residential Substance Abuse Treatment - Adults The [employer]'s "[id number]" is a great source of information about community services available.  Access by dialing 2-1-1 from anywhere in [place] , or by website -  PooledIncome.pl.    (Updated 05/2015)   Crisis Assistance 24 hours a day     Services Offered       Area [employer] 24-hour crisis assistance: [phone] [place], [place]  [facility] 24-hour crisis assistance:[phone] [place], [place]  [facility] 24-hour crisis assistance: [phone] [place], [place]  [facility] Access to Care Line 24-hour crisis assistance; [phone] All  [facility] 24-hour crisis response line: [phone] All    Other Local Resources (Updated 02/2015)   Inpatient Behavioral Health/Residential Substance Abuse Treatment Programs     Services          Address and Phone Number  [facility] ([facility])   14-day residential rehabilitation  [phone] [address] [place], [place]  [facility] ([facility])    Detox - private pay only 14-day residential rehabilitation -  Medicaid, insurance, private pay only [phone], or [phone] [address] [place], [place] [address]   [facility] only Multiple facilities [phone] admissions    [facility] ([facility])   90-day program Must be homeless to participate   [phone], or [phone] Daniel Mcalpine, [facility]  [facility] only [phone], or  [phone] [address] [place], [place] [address]  [facility] Residential Treatment Services Must make an appointment Accepts private pay, Ursula Mosses [facility] [phone]  [address] [place], [place] [address]   [facility] Females only Associated with the [facility] [phone] [address] [place], [place] [address]  [facility] only [phone], or [phone] [address] [place], [id number]  [facility]     Detox Residential rehabilitation Private insurance only Multiple locations [phone] admissions  [facility]     Private pay Private insurance [phone] [address] [place], [place] [address]  [facility]     Males only Fee required at time of admission [phone] [address] [place], [place] [address]  [facility]     Private pay only   [phone] [address] [place], [place]  RTS (Residential Firefighter)    Detox - private pay, Medicaid Residential rehabilitation for males  - Medicare, Medicaid, insurance, private pay [phone] [address] [place], [place]   [facility]              Walk-in interviews Monday - Saturday from 8 am - 4 pm Individuals with legal charges are not eligible [phone] [address] [place], [place] [address]  [facility]  Must be willing to work Must attend Alcoholics Anonymous meetings [phone] [address] [place], [place]   [facility] [employer]    Faith-based program Private pay only [phone] [address] [place], [place]                                        Outpatient Substance Abuse  Treatment- uninsured  Narcotics Anonymous 24-HOUR HELPLINE Pre-recorded for Meeting Schedules [facility] [phone]  [email or web address] ALCOHOLICS ANONYMOUS  [place]    Answering Service [phone] Please Note: All [place]  Meetings are Non-smoking FindSpice.es  [facility] Drug Services -  Insurance: [employer] /State funding/private insurance Methadone, suboxone/Intensive outpatient  [place]   [phone] Fax: [phone] [address] [place], [place], [address] [place] [phone] Fax: [phone]    [address] [place], [place], [address] ([address] [place], [place], [place], [place], [place], [place], [place], [place]) [facility] [email or web address]/ Accepts State funding/Medicaid Transitional housing, Intensive Outpatient Treatment, Outpatient treatment, Veterans Services  Phone: [phone] Fax: [phone] Address: [address], [place] [address]   [employer] of Care (http://[email or web address].info/) Insurance: Medicaid Case Management, Administrator, arts, Medication Management, Outpatient Therapy, Psychosocial Rehabilitation, Substance Abuse Intensive Outpatient  Phone: [phone] Fax: [phone] [address], [place], [place], [address]  [facility] Medicaid, most private insurance providers Types of Program: Individual/Group Therapy, Substance Abuse Treatment  Phone: [place] [phone] Fax: [phone] [address], [place], [place], [address] [place] [phone] [address], [address] [place], [place], [address]  [facility]  Medicaid Types of Program: SAIOP  Phone: [phone] Fax: [phone] [address] [place], [place], [place], [address] [facility] Medicaid/state funds Crisis line [phone] [facility] [phone] [place] [phone] [place] [phone]  [facility] [address];  [place], [place] [address] [phone]  Substance Abuse Intensive Outpatient Program [facility] [address] [place], [place] [address] [phone]- Substance abuse treatment  Successful Transitions  Insurance: [facility], [address], sliding scale Types of Program: substance abuse treatment, transportation assistance Phone: [phone] Fax: [phone] Address: [address], [place] [address] [facility] (TrendSwap.[email or web address]) Insurance: UHC, [place], [place], [place] of  [place] Program: addiction counseling, detoxification,  Phone: [phone]  Fax: [phone] Address: [address] [place], [place] [address]  [facility] (statewide facilities/programs) [address] (Medicaid/state funds) [place], [place] [address]                      http://barrett.[email or web address]/ [phone] Daniel mcalpine- [phone] [place]- [phone] [facility] [place] (2 Locations) (Medicaid/state funds) --[address]  walk in 8:30-12 and 1-2:30 [place], [id number]   [facility]- [phone] --[address] [place], [place] [address]  [phone] walk in 8:30-12 and 2-3:30  [facility] state funds/medicaid [address] [place], [place] [address] [id number] [facility] (Suboxone clinic) Medicaid/state funds  [address]  [place], [place] [address] [phone]   [facility]  [address] [place], [place] [address]  [phone] (24 hours) [place]- [address] [place], [place] [address]  [phone] (24 hours) [place]- [address] [place] [phone] [place]- [address] [place] [phone] [place] [address] [place] [phone] [facility]- Medicaid and state funds  [place]- [address] [place], [place] [address] [phone] (24 hours) [place]- [address] [place], [place] [address] [phone] [facility]- [address] [place], [place] [address] [phone] (24 hours) [place] [address] [place], [place]  [address] [phone] [place]- [address] [facility] [address] [place] [phone]

## 2024-02-02 NOTE — Progress Notes (Signed)
 Renda, RN will take report when available.

## 2024-02-03 ENCOUNTER — Inpatient Hospital Stay (HOSPITAL_COMMUNITY)

## 2024-02-03 ENCOUNTER — Inpatient Hospital Stay (HOSPITAL_COMMUNITY): Admitting: Anesthesiology

## 2024-02-03 ENCOUNTER — Encounter (HOSPITAL_COMMUNITY): Admission: EM | Disposition: A | Payer: Self-pay | Source: Home / Self Care | Attending: Family Medicine

## 2024-02-03 ENCOUNTER — Encounter (HOSPITAL_COMMUNITY): Payer: Self-pay | Admitting: Internal Medicine

## 2024-02-03 DIAGNOSIS — K859 Acute pancreatitis without necrosis or infection, unspecified: Secondary | ICD-10-CM

## 2024-02-03 DIAGNOSIS — K8050 Calculus of bile duct without cholangitis or cholecystitis without obstruction: Secondary | ICD-10-CM

## 2024-02-03 DIAGNOSIS — K838 Other specified diseases of biliary tract: Secondary | ICD-10-CM | POA: Diagnosis not present

## 2024-02-03 DIAGNOSIS — K2970 Gastritis, unspecified, without bleeding: Secondary | ICD-10-CM

## 2024-02-03 DIAGNOSIS — K2960 Other gastritis without bleeding: Secondary | ICD-10-CM

## 2024-02-03 DIAGNOSIS — K2289 Other specified disease of esophagus: Secondary | ICD-10-CM | POA: Diagnosis not present

## 2024-02-03 DIAGNOSIS — R7989 Other specified abnormal findings of blood chemistry: Secondary | ICD-10-CM

## 2024-02-03 HISTORY — PX: BIOPSY OF SKIN SUBCUTANEOUS TISSUE AND/OR MUCOUS MEMBRANE: SHX6741

## 2024-02-03 HISTORY — PX: BILIARY DILATION: SHX6850

## 2024-02-03 HISTORY — PX: ERCP: SHX5425

## 2024-02-03 HISTORY — PX: STONE EXTRACTION WITH BASKET: SHX5318

## 2024-02-03 HISTORY — PX: SPHINCTEROTOMY: SHX5279

## 2024-02-03 LAB — CBC WITH DIFFERENTIAL/PLATELET
Abs Immature Granulocytes: 0.02 K/uL (ref 0.00–0.07)
Basophils Absolute: 0 K/uL (ref 0.0–0.1)
Basophils Relative: 0 %
Eosinophils Absolute: 0 K/uL (ref 0.0–0.5)
Eosinophils Relative: 0 %
HCT: 44.9 % (ref 39.0–52.0)
Hemoglobin: 15.9 g/dL (ref 13.0–17.0)
Immature Granulocytes: 0 %
Lymphocytes Relative: 6 %
Lymphs Abs: 0.4 K/uL — ABNORMAL LOW (ref 0.7–4.0)
MCH: 30.5 pg (ref 26.0–34.0)
MCHC: 35.4 g/dL (ref 30.0–36.0)
MCV: 86.2 fL (ref 80.0–100.0)
Monocytes Absolute: 0.1 K/uL (ref 0.1–1.0)
Monocytes Relative: 1 %
Neutro Abs: 5.9 K/uL (ref 1.7–7.7)
Neutrophils Relative %: 93 %
Platelets: 165 K/uL (ref 150–400)
RBC: 5.21 MIL/uL (ref 4.22–5.81)
RDW: 13.1 % (ref 11.5–15.5)
WBC: 6.4 K/uL (ref 4.0–10.5)
nRBC: 0 % (ref 0.0–0.2)

## 2024-02-03 LAB — COMPREHENSIVE METABOLIC PANEL WITH GFR
ALT: 183 U/L — ABNORMAL HIGH (ref 0–44)
AST: 214 U/L — ABNORMAL HIGH (ref 15–41)
Albumin: 3.6 g/dL (ref 3.5–5.0)
Alkaline Phosphatase: 770 U/L — ABNORMAL HIGH (ref 38–126)
Anion gap: 11 (ref 5–15)
BUN: 12 mg/dL (ref 8–23)
CO2: 26 mmol/L (ref 22–32)
Calcium: 10 mg/dL (ref 8.9–10.3)
Chloride: 98 mmol/L (ref 98–111)
Creatinine, Ser: 1.13 mg/dL (ref 0.61–1.24)
GFR, Estimated: >60 mL/min
Glucose, Bld: 192 mg/dL — ABNORMAL HIGH (ref 70–99)
Potassium: 3.9 mmol/L (ref 3.5–5.1)
Sodium: 135 mmol/L (ref 135–145)
Total Bilirubin: 2.2 mg/dL — ABNORMAL HIGH (ref 0.0–1.2)
Total Protein: 6.7 g/dL (ref 6.5–8.1)

## 2024-02-03 LAB — MAGNESIUM: Magnesium: 1.9 mg/dL (ref 1.7–2.4)

## 2024-02-03 LAB — LIPASE, BLOOD: Lipase: 1578 U/L — ABNORMAL HIGH (ref 11–51)

## 2024-02-03 LAB — PHOSPHORUS: Phosphorus: 4.1 mg/dL (ref 2.5–4.6)

## 2024-02-03 MED ORDER — ROCURONIUM BROMIDE 10 MG/ML (PF) SYRINGE
PREFILLED_SYRINGE | INTRAVENOUS | Status: DC | PRN
  Administered 2024-02-03: 50 mg via INTRAVENOUS

## 2024-02-03 MED ORDER — EPHEDRINE SULFATE-NACL 50-0.9 MG/10ML-% IV SOSY
PREFILLED_SYRINGE | INTRAVENOUS | Status: DC | PRN
  Administered 2024-02-03: 5 mg via INTRAVENOUS
  Administered 2024-02-03: 10 mg via INTRAVENOUS

## 2024-02-03 MED ORDER — FENTANYL CITRATE (PF) 100 MCG/2ML IJ SOLN
INTRAMUSCULAR | Status: AC
  Filled 2024-02-03: qty 2

## 2024-02-03 MED ORDER — GLUCAGON HCL RDNA (DIAGNOSTIC) 1 MG IJ SOLR
INTRAMUSCULAR | Status: DC | PRN
  Administered 2024-02-03 (×2): .25 mg via INTRAVENOUS

## 2024-02-03 MED ORDER — MIDAZOLAM HCL (PF) 2 MG/2ML IJ SOLN
INTRAMUSCULAR | Status: DC | PRN
  Administered 2024-02-03: 2 mg via INTRAVENOUS

## 2024-02-03 MED ORDER — SODIUM CHLORIDE 0.9 % IV SOLN
INTRAVENOUS | Status: DC | PRN
  Administered 2024-02-03: 25 mL

## 2024-02-03 MED ORDER — MIDAZOLAM HCL 2 MG/2ML IJ SOLN
INTRAMUSCULAR | Status: AC
  Filled 2024-02-03: qty 2

## 2024-02-03 MED ORDER — KETOROLAC TROMETHAMINE 15 MG/ML IJ SOLN
7.5000 mg | INTRAMUSCULAR | Status: AC
  Administered 2024-02-03: 7.5 mg via INTRAVENOUS

## 2024-02-03 MED ORDER — OXYCODONE HCL 5 MG PO TABS: 5.0000 mg | ORAL_TABLET | ORAL | Status: DC | PRN

## 2024-02-03 MED ORDER — GLUCAGON HCL RDNA (DIAGNOSTIC) 1 MG IJ SOLR
INTRAMUSCULAR | Status: AC
  Filled 2024-02-03: qty 1

## 2024-02-03 MED ORDER — ONDANSETRON HCL 4 MG/2ML IJ SOLN
INTRAMUSCULAR | Status: DC | PRN
  Administered 2024-02-03: 4 mg via INTRAVENOUS

## 2024-02-03 MED ORDER — FENTANYL CITRATE (PF) 250 MCG/5ML IJ SOLN
INTRAMUSCULAR | Status: DC | PRN
  Administered 2024-02-03: 100 ug via INTRAVENOUS

## 2024-02-03 MED ORDER — PANTOPRAZOLE SODIUM 40 MG PO TBEC
40.0000 mg | DELAYED_RELEASE_TABLET | Freq: Every day | ORAL | Status: DC
  Administered 2024-02-03 – 2024-02-05 (×3): 40 mg via ORAL
  Filled 2024-02-03 (×3): qty 1

## 2024-02-03 MED ORDER — PROPOFOL 10 MG/ML IV BOLUS
INTRAVENOUS | Status: DC | PRN
  Administered 2024-02-03: 180 mg via INTRAVENOUS

## 2024-02-03 MED ORDER — GLYCOPYRROLATE 0.2 MG/ML IJ SOLN
INTRAMUSCULAR | Status: DC | PRN
  Administered 2024-02-03: .2 mg via INTRAVENOUS

## 2024-02-03 MED ORDER — DICLOFENAC SUPPOSITORY 100 MG
RECTAL | Status: AC
  Filled 2024-02-03: qty 1

## 2024-02-03 MED ORDER — DICLOFENAC SUPPOSITORY 100 MG
RECTAL | Status: DC | PRN
  Administered 2024-02-03: 100 mg via RECTAL

## 2024-02-03 MED ORDER — SUGAMMADEX SODIUM 200 MG/2ML IV SOLN
INTRAVENOUS | Status: DC | PRN
  Administered 2024-02-03: 200 mg via INTRAVENOUS

## 2024-02-03 MED ORDER — CIPROFLOXACIN IN D5W 400 MG/200ML IV SOLN
INTRAVENOUS | Status: DC | PRN
  Administered 2024-02-03: 400 mg via INTRAVENOUS

## 2024-02-03 MED ORDER — CIPROFLOXACIN IN D5W 400 MG/200ML IV SOLN
INTRAVENOUS | Status: AC
  Filled 2024-02-03: qty 200

## 2024-02-03 NOTE — Plan of Care (Signed)

## 2024-02-03 NOTE — Plan of Care (Signed)
" °  Problem: Education: Goal: Knowledge of General Education information will improve Description: Including pain rating scale, medication(s)/side effects and non-pharmacologic comfort measures 02/03/2024 1602 by Lynnette Cena CROME, RN Outcome: Progressing 02/03/2024 1601 by Lynnette Cena CROME, RN Outcome: Progressing   Problem: Health Behavior/Discharge Planning: Goal: Ability to manage health-related needs will improve Outcome: Progressing   "

## 2024-02-03 NOTE — Transfer of Care (Signed)
 Immediate Anesthesia Transfer of Care Note  Patient: Eddie [name]  Procedure(s) Performed: ERCP, WITH INTERVENTION IF INDICATED SPHINCTEROTOMY, BILIARY DILATION, STRICTURE, BILE DUCT BIOPSY, SKIN, SUBCUTANEOUS TISSUE, OR MUCOUS MEMBRANE ERCP, WITH LITHROTRIPSY OR REMOVAL OF COMMON BILE DUCT CALCULUS USING BALLOON  Patient Location: PACU  Anesthesia Type:General  Level of Consciousness: awake, alert , and oriented  Airway & Oxygen Therapy: Patient Spontanous Breathing and Patient connected to nasal cannula oxygen  Post-op Assessment: Report given to RN and Post -op Vital signs reviewed and stable  Post vital signs: Reviewed and stable  Last Vitals:  Vitals Value Taken Time  BP 101/72 02/03/24 08:38  Temp 36.6 C 02/03/24 08:35  Pulse 68 02/03/24 08:42  Resp 16 02/03/24 08:42  SpO2 93 % 02/03/24 08:42  Vitals shown include unfiled device data.  Last Pain:  Vitals:   02/03/24 0835  TempSrc:   PainSc: 0-No pain      Patients Stated Pain Goal: 0 (02/02/24 2355)  Complications: No notable events documented.

## 2024-02-03 NOTE — Progress Notes (Addendum)
 Pt taken down for ERCP.

## 2024-02-03 NOTE — H&P (View-Only) (Signed)
 Reason for Consult:biliary pancreatitis Referring Physician: [name] is an 64 y.o. male.  HPI: 64yo M admitted 12/31 with epigastric pain going through to his back. W/U was C/W choledocholithiasis and biliary pancreatitis. Lipase yesterday was 1193. He underwent ERCP today by Dr. Wilhelmenia. We are consulted for consideration of cholecystectomy. He is eating lunch and reports his epigastric pain is resolved.  Past Medical History:  Diagnosis Date   Diverticulitis    Hypertension     Past Surgical History:  Procedure Laterality Date   HERNIA REPAIR     LAPAROSCOPIC APPENDECTOMY N/A 03/01/2013   Procedure: APPENDECTOMY LAPAROSCOPIC;  Surgeon: Oneil DELENA Budge, MD;  Location: [facility];  Service: General;  Laterality: N/A;    History reviewed. No pertinent family history.  Social History:  reports that he has been smoking cigarettes. He does not have any smokeless tobacco history on file. He reports current alcohol use. He reports that he does not use drugs.  Allergies: Allergies[1]  Medications: I have reviewed the patient's current medications.  Results for orders placed or performed during the hospital encounter of 01/31/24 (from the past 48 hours)  CBC with Differential/Platelet     Status: Abnormal   Collection Time: 02/02/24  6:50 AM  Result Value Ref Range   WBC 7.0 4.0 - 10.5 K/uL   RBC 4.56 4.22 - 5.81 MIL/uL   Hemoglobin 13.9 13.0 - 17.0 g/dL   HCT 60.9 60.9 - 47.9 %   MCV 85.5 80.0 - 100.0 fL   MCH 30.5 26.0 - 34.0 pg   MCHC 35.6 30.0 - 36.0 g/dL   RDW 86.6 88.4 - 84.4 %   Platelets 147 (L) 150 - 400 K/uL   nRBC 0.0 0.0 - 0.2 %   Neutrophils Relative % 56 %   Neutro Abs 3.8 1.7 - 7.7 K/uL   Lymphocytes Relative 35 %   Lymphs Abs 2.4 0.7 - 4.0 K/uL   Monocytes Relative 7 %   Monocytes Absolute 0.5 0.1 - 1.0 K/uL   Eosinophils Relative 2 %   Eosinophils Absolute 0.2 0.0 - 0.5 K/uL   Basophils Relative 0 %   Basophils Absolute 0.0 0.0 - 0.1  K/uL   Immature Granulocytes 0 %   Abs Immature Granulocytes 0.03 0.00 - 0.07 K/uL    Comment: Performed at [facility], [address], [place], [place] [address]  Comprehensive metabolic panel     Status: Abnormal   Collection Time: 02/02/24  6:50 AM  Result Value Ref Range   Sodium 134 (L) 135 - 145 mmol/L   Potassium 4.2 3.5 - 5.1 mmol/L   Chloride 102 98 - 111 mmol/L   CO2 23 22 - 32 mmol/L   Glucose, Bld 86 70 - 99 mg/dL    Comment: Glucose reference range applies only to samples taken after fasting for at least 8 hours.   BUN 17 8 - 23 mg/dL   Creatinine, Ser 9.04 0.61 - 1.24 mg/dL   Calcium 9.2 8.9 - 89.6 mg/dL   Total Protein 5.4 (L) 6.5 - 8.1 g/dL   Albumin 3.0 (L) 3.5 - 5.0 g/dL   AST 30 15 - 41 U/L   ALT 101 (H) 0 - 44 U/L   Alkaline Phosphatase 326 (H) 38 - 126 U/L   Total Bilirubin 0.5 0.0 - 1.2 mg/dL   GFR, Estimated >39 >39 mL/min    Comment: (NOTE) Calculated using the CKD-EPI Creatinine Equation (2021)    Anion gap  9 5 - 15    Comment: Performed at [facility], [address] [place], [place] [address]  Lipase, blood     Status: Abnormal   Collection Time: 02/02/24  6:50 AM  Result Value Ref Range   Lipase 1,193 (H) 11 - 51 U/L    Comment: Performed at [facility], [address] [place], [place] [address]    No results found.  Review of Systems  Constitutional:  Positive for appetite change.  HENT: Negative.    Eyes:        Icterus  Respiratory: Negative.    Cardiovascular: Negative.   Gastrointestinal:        Abdominal pain resolved  Musculoskeletal:        Back pain resolved  Neurological: Negative.   Hematological: Negative.   Psychiatric/Behavioral: Negative.     Blood pressure (!) 150/88, pulse (!) 56, temperature 97.9 F (36.6 C), temperature source Oral, resp. rate 14, weight 91.9 kg, SpO2 94%. Physical Exam Eyes:     General: Scleral icterus present.  Cardiovascular:     Rate and Rhythm: Normal rate and  regular rhythm.  Pulmonary:     Effort: Pulmonary effort is normal.     Breath sounds: No wheezing.  Abdominal:     Palpations: Abdomen is soft.     Tenderness: There is no abdominal tenderness. There is no guarding or rebound.  Skin:    General: Skin is warm.  Neurological:     Mental Status: He is alert and oriented to person, place, and time.  Psychiatric:        Mood and Affect: Mood normal.     Assessment/Plan: Biliary pancreatitis -status post ERCP. We will follow and plan laparoscopic cholecystectomy once pancreatitis improves.  Agree with low fat diet. Also noted plan by Dr. Perri to obtain an echocardiogram.  I discussed the plan with the patient and his family and answered their questions.  Eddie [name] 02/03/2024, 1:14 PM         [1] No Known Allergies

## 2024-02-03 NOTE — Anesthesia Procedure Notes (Signed)
 Procedure Name: Intubation Date/Time: 02/03/2024 7:45 AM  Performed by: Evette Ade, CRNAPre-anesthesia Checklist: Patient identified, Emergency Drugs available, Suction available, Patient being monitored and Timeout performed Preoxygenation: Pre-oxygenation with 100% oxygen Induction Type: IV induction Laryngoscope Size: 4 Grade View: Grade I Tube type: Oral Tube size: 7.5 mm Airway Equipment and Method: Stylet Placement Confirmation: ETT inserted through vocal cords under direct vision and positive ETCO2 Secured at: 21 cm Tube secured with: Tape Dental Injury: Teeth and Oropharynx as per pre-operative assessment

## 2024-02-03 NOTE — Interval H&P Note (Signed)
 History and Physical Interval Note:  02/03/2024 7:29 AM  Eddie [name]  has presented today for surgery, with the diagnosis of Choledocholithiasis.  The various methods of treatment have been discussed with the patient and family. After consideration of risks, benefits and other options for treatment, the patient has consented to  Procedures: ERCP, WITH INTERVENTION IF INDICATED (N/A) as a surgical intervention.  The patient's history has been reviewed, patient examined, no change in status, stable for surgery.  I have reviewed the patient's chart and labs.  Questions were answered to the patient's satisfaction.    The risks of an ERCP were discussed at length, including but not limited to the risk of perforation, bleeding, abdominal pain, post-ERCP pancreatitis (while usually mild can be severe and even life threatening).   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  If unsuccessful may require PTBD.    Zymier Rodgers Mansouraty Jr

## 2024-02-03 NOTE — Plan of Care (Signed)
   Problem: Education: Goal: Knowledge of General Education information will improve Description Including pain rating scale, medication(s)/side effects and non-pharmacologic comfort measures Outcome: Progressing

## 2024-02-03 NOTE — Op Note (Signed)
 [facility] Patient Name: Eddie [name] Procedure Date : 02/03/2024 MRN: [id number] Attending MD: Aloha Finner , MD, [id number] Date of Birth: 04/09/1960 CSN: [id number] Age: 64 Admit Type: Inpatient Procedure:                ERCP Indications:              Bile duct stone(s), Acute pancreatitis Providers:                Aloha Finner, MD, Ozell Pouch, Fairy [place], Technician Referring MD:              Medicines:                General Anesthesia, Cipro  400 mg IV, Diclofenac  100                            mg rectal, Glucagon  0.5 mg IV Complications:            No immediate complications. Estimated Blood Loss:     Estimated blood loss was minimal. Procedure:                Pre-Anesthesia Assessment:                           - Prior to the procedure, a History and Physical                            was performed, and patient medications and                            allergies were reviewed. The patient's tolerance of                            previous anesthesia was also reviewed. The risks                            and benefits of the procedure and the sedation                            options and risks were discussed with the patient.                            All questions were answered, and informed consent                            was obtained. Prior Anticoagulants: The patient has                            taken no anticoagulant or antiplatelet agents. ASA                            Grade Assessment: III - A patient with severe  systemic disease. After reviewing the risks and                            benefits, the patient was deemed in satisfactory                            condition to undergo the procedure.                           After obtaining informed consent, the scope was                            passed under direct vision. Throughout the                             procedure, the patient's blood pressure, pulse, and                            oxygen saturations were monitored continuously. The                            [employer] D single use                            duodenoscope was introduced through the mouth, and                            used to inject contrast into and used to inject                            contrast into the bile duct. The ERCP was                            accomplished without difficulty. The patient                            tolerated the procedure. Scope In: Scope Out: Findings:      The scout film was normal.      The upper GI tract was traversed under direct vision without detailed       examination. The Z-line was irregular. Patchy mild inflammation       characterized by erosions and erythema was found in the entire examined       stomach - biopsied for HP assessment. No gross lesions were noted in the       duodenal bulb, in the first portion of the duodenum and in the second       portion of the duodenum. The major papilla was located entirely within a       diverticulum. The major papilla was edematous.      A 0.035 inch x 260 cm straight Hydra Jagwire was passed into the biliary       tree. The Hydratome sphincterotome was passed over the guidewire and the       bile duct was then deeply cannulated. Contrast was injected. I       personally interpreted the bile  duct images. Ductal flow of contrast was       adequate. Image quality was adequate. Contrast extended to the entire       biliary tree. Opacification of the entire biliary tree except for the       gallbladder was successful. The middle third of the main bile duct       contained filling defect thought to be a stone. The lower third of the       main bile duct and middle third of the main bile duct were moderately       dilated. The largest diameter was 14 mm. A 6 mm biliary sphincterotomy       was made with a monofilament  Hydratome sphincterotome using ERBE       electrocautery. There was no post-sphincterotomy bleeding. Dilation of       the sphincterotomy site as a DASE sphincteroplasty with an 09-08-08 mm       balloon (to a maximum balloon size of 10 mm) dilator was successful for       2 minutes. To discover objects, the biliary tree was swept with a       retrieval balloon. Sludge was swept from the duct. One stone was       removed. No stones remained. An occlusion cholangiogram was performed       that showed no further significant biliary pathology.      A pancreatogram was not performed.      The duodenoscope was withdrawn from the patient. Impression:               - Z-line irregular.                           - Gastritis. Biopsied.                           - No gross lesions in the duodenal bulb, in the                            first portion of the duodenum and in the second                            portion of the duodenum.                           - The major papilla was located entirely within a                            diverticulum. The major papilla appeared edematous.                           - A filling defect consistent with a stone was seen                            on the cholangiogram.                           - The middle third of the main bile duct and lower  third of the main bile duct were moderately dilated.                           - Choledocholithiasis was found. Complete removal                            was accomplished by biliary sphincterotomy and                            balloon sphincteroplasty and balloon sweeping. Recommendation:           - The patient will be observed post-procedure,                            until all discharge criteria are met.                           - Return patient to hospital ward for ongoing care.                           - Advance diet as tolerated.                           - Observe patient's  clinical course.                           - Check liver enzymes (AST, ALT, alkaline                            phosphatase, bilirubin) in the morning.                           - Cholecystectomy at timing/discretion of surgical                            colleagues.                           - Watch for pancreatitis, bleeding, perforation,                            and cholangitis.                           - Minimize VTE PPx if possible for 24 hours to                            decrease risk of post-sphincterotomy bleeding.                           - Start PPI 40 mg daily.                           - Antibiotics further as per medical/surgical  services.                           - [facility] will continue to follow the patient                            during this hospitalization; if issues arise,                            please contact their team.                           - The findings and recommendations were discussed                            with the patient.                           - The findings and recommendations were discussed                            with the referring physician. Procedure Code(s):        --- Professional ---                           [id number], 59, Endoscopic retrograde                            cholangiopancreatography (ERCP); with                            trans-endoscopic balloon dilation of                            biliary/pancreatic duct(s) or of ampulla                            (sphincteroplasty), including sphincterotomy, when                            performed, each duct                           43264, Endoscopic retrograde                            cholangiopancreatography (ERCP); with removal of                            calculi/debris from biliary/pancreatic duct(s)                           43261, 59, Endoscopic retrograde                            cholangiopancreatography (ERCP); with biopsy,  single or multiple                           74328, 26, Endoscopic catheterization of the                            biliary ductal system, radiological supervision and                            interpretation Diagnosis Code(s):        --- Professional ---                           K22.89, Other specified disease of esophagus                           K29.70, Gastritis, unspecified, without bleeding                           K83.8, Other specified diseases of biliary tract                           K80.50, Calculus of bile duct without cholangitis                            or cholecystitis without obstruction                           K85.90, Acute pancreatitis without necrosis or                            infection, unspecified                           R93.2, Abnormal findings on diagnostic imaging of                            liver and biliary tract CPT copyright 2022 [employer] Association. All rights reserved. The codes documented in this report are preliminary and upon coder review may  be revised to meet current compliance requirements. Aloha Finner, MD 02/03/2024 8:36:41 AM Number of Addenda: 0

## 2024-02-03 NOTE — Progress Notes (Signed)
 " PROGRESS NOTE    Eddie [name]  FMW:[id number] DOB: 1960-12-24 DOA: 01/31/2024 PCP: [facility] Complaint  Patient presents with   Abdominal Pain    Brief Narrative:   Eddie [name] is Subhan Hoopes 64 y.o. male with medical history significant for diverticulosis, hypertension who presented with acute on chronic abdominal pain, ongoing for weeks, which worsened leading to him presenting to the hospital.    He's been admitted with gallstone pancreatitis. Now s/p ERCP.  General surgery consult pending.  Assessment & Plan:   Principal Problem:   Acute pancreatitis Active Problems:   Elevated liver enzymes   Essential hypertension   Alcohol abuse   Diverticulosis   AKI (acute kidney injury)   Choledocholithiasis   Erosive gastritis   Abnormal LFTs  Gallstone Pancreatitis Choledocholithiasis  MRCP with obstructing choledocholithiasis with intrahepatic and extrahepatic biliary dilatation LFT's continues to improved today - alk phos, AST, ALT remain elevated.  Bili normalized.  Labs today pending. Now s/p ERCP, choledocholithiasis, complete removal with biliary sphincterotomy and balloon sphincteroplasty and balloon sweeping - watch for pancreatitis, bleeding, perforation, cholangitis General surgery consulted, appreciate assistance GI c/s, appreciate recs Continue IVF  Strict I/O, daily weights  Systemic Inflammatory Response Syndrome Hypothermia, leukocytosis at presentation  Possible sign of infection in setting of above Will d/c abx now s/p ERCP  Gastritis Noted on ERCP, biopsied (follow results) PPI daily  Acute Kidney Injury Improving with IVF UA with 0-5 RBC, 30 mg/dl protein - 88-79 WBC resolved  Sinus Bradycardia Abnormal EKG with inferior q waves Follow echo   HTN Holding amlodipine, benicar for now - BP now on low side, follow with IVF  Hyponatremia Improving with IVF, at least partially related to hypovolemia Labs pending  today  Dyslipidemia Holding crestor   Etoh Abuse Patient reports last drink 1 week ago CIWA  GERD PPI    DVT prophylaxis: SCD Code Status: full Family Communication: none Disposition:   Status is: Inpatient Remains inpatient appropriate because: need for continued inpatient care   Consultants:  GI, magod  Procedures:  none  Antimicrobials:  Anti-infectives (From admission, onward)    Start     Dose/Rate Route Frequency Ordered Stop   02/01/24 2200  cefTRIAXone  (ROCEPHIN ) 2 g in sodium chloride  0.9 % 100 mL IVPB        2 g 200 mL/hr over 30 Minutes Intravenous Every 24 hours 02/01/24 0600     02/01/24 1400  metroNIDAZOLE  (FLAGYL ) IVPB 500 mg        500 mg 100 mL/hr over 60 Minutes Intravenous Every 12 hours 02/01/24 0600     02/01/24 0000  ciprofloxacin  (CIPRO ) IVPB 400 mg        400 mg 200 mL/hr over 60 Minutes Intravenous  Once 01/31/24 2355 02/01/24 0312   02/01/24 0000  metroNIDAZOLE  (FLAGYL ) IVPB 500 mg        500 mg 100 mL/hr over 60 Minutes Intravenous  Once 01/31/24 2355 02/01/24 0204       Subjective: Feeling better after ERCP Daughter, son in law at bedside  Objective: Vitals:   02/03/24 0835 02/03/24 0845 02/03/24 0900 02/03/24 0918  BP: 101/72 115/78 125/71 132/77  Pulse: 73 97 (!) 56 (!) 57  Resp: 18 14 17 20   Temp: 97.8 F (36.6 C)     TempSrc:      SpO2: 96% 92% 90% 94%  Weight:        Intake/Output Summary (Last 24 hours)  at 02/03/2024 1149 Last data filed at 02/03/2024 0900 Gross per 24 hour  Intake 2226.29 ml  Output 250 ml  Net 1976.29 ml   Filed Weights   02/02/24 0403 02/03/24 0500  Weight: 92.3 kg 91.9 kg    Examination:  General: No acute distress. Lungs: unlabored Abdomen: Soft, nontender, nondistended  Neurological: Alert and oriented 3. Moves all extremities 4 with equal strength. Cranial nerves II through XII grossly intact. Extremities: No clubbing or cyanosis. No edema.   Data Reviewed: I have personally  reviewed following labs and imaging studies  CBC: Recent Labs  Lab 01/31/24 2041 02/01/24 0410 02/02/24 0650  WBC 17.4* 13.2* 7.0  NEUTROABS  --   --  3.8  HGB 16.1 15.3 13.9  HCT 45.3 42.8 39.0  MCV 87.1 85.8 85.5  PLT 170 149* 147*    Basic Metabolic Panel: Recent Labs  Lab 01/31/24 2041 02/01/24 0410 02/02/24 0650  NA 127* 129* 134*  K 4.9 4.6 4.2  CL 93* 95* 102  CO2 22 22 23   GLUCOSE 128* 97 86  BUN 32* 31* 17  CREATININE 2.41* 1.93* 0.95  CALCIUM 10.2 9.6 9.2    GFR: CrCl cannot be calculated (Unknown ideal weight.).  Liver Function Tests: Recent Labs  Lab 01/31/24 2041 02/01/24 0410 02/02/24 0650  AST 129* 89* 30  ALT 230* 185* 101*  ALKPHOS 570* 487* 326*  BILITOT 1.7* 0.8 0.5  PROT 6.9 6.4* 5.4*  ALBUMIN 3.7 3.3* 3.0*    CBG: No results for input(s): GLUCAP in the last 168 hours.   Recent Results (from the past 240 hours)  Blood culture (routine x 2)     Status: None (Preliminary result)   Collection Time: 01/31/24 11:57 PM   Specimen: BLOOD  Result Value Ref Range Status   Specimen Description BLOOD SITE NOT SPECIFIED  Final   Special Requests   Final    BOTTLES DRAWN AEROBIC AND ANAEROBIC Blood Culture adequate volume   Culture   Final    NO GROWTH 2 DAYS Performed at [facility], [address] [place], [place] [address]    Report Status PENDING  Incomplete  Blood culture (routine x 2)     Status: None (Preliminary result)   Collection Time: 02/01/24 12:02 AM   Specimen: BLOOD  Result Value Ref Range Status   Specimen Description BLOOD RIGHT ANTECUBITAL  Final   Special Requests   Final    BOTTLES DRAWN AEROBIC AND ANAEROBIC Blood Culture adequate volume   Culture   Final    NO GROWTH 2 DAYS Performed at [facility], [address] [place], [place] [address]    Report Status PENDING  Incomplete         Radiology Studies: No results found.       Scheduled Meds:  LORazepam   0-4 mg Oral Q12H    multivitamin with minerals  1 tablet Oral Daily   pantoprazole   40 mg Oral Daily   Continuous Infusions:  cefTRIAXone  (ROCEPHIN )  IV Stopped (02/02/24 2320)   metronidazole  Stopped (02/03/24 0257)     LOS: 2 days    Time spent: over 30 min     Meliton Monte, MD [facility]   To contact the attending provider between 7A-7P or the covering provider during after hours 7P-7A, please log into the web site [email or web address] and access using universal [place] password for that web site. If you do not have the password, please call the hospital operator.  02/03/2024, 11:49 AM    "

## 2024-02-03 NOTE — Consult Note (Signed)
 Reason for Consult:biliary pancreatitis Referring Physician: [name] is an 64 y.o. male.  HPI: 64yo M admitted 12/31 with epigastric pain going through to his back. W/U was C/W choledocholithiasis and biliary pancreatitis. Lipase yesterday was 1193. He underwent ERCP today by Dr. Wilhelmenia. We are consulted for consideration of cholecystectomy. He is eating lunch and reports his epigastric pain is resolved.  Past Medical History:  Diagnosis Date   Diverticulitis    Hypertension     Past Surgical History:  Procedure Laterality Date   HERNIA REPAIR     LAPAROSCOPIC APPENDECTOMY N/A 03/01/2013   Procedure: APPENDECTOMY LAPAROSCOPIC;  Surgeon: Eddie DELENA Budge, MD;  Location: [facility];  Service: General;  Laterality: N/A;    History reviewed. No pertinent family history.  Social History:  reports that he has been smoking cigarettes. He does not have any smokeless tobacco history on file. He reports current alcohol use. He reports that he does not use drugs.  Allergies: Allergies[1]  Medications: I have reviewed the patient's current medications.  Results for orders placed or performed during the hospital encounter of 01/31/24 (from the past 48 hours)  CBC with Differential/Platelet     Status: Abnormal   Collection Time: 02/02/24  6:50 AM  Result Value Ref Range   WBC 7.0 4.0 - 10.5 K/uL   RBC 4.56 4.22 - 5.81 MIL/uL   Hemoglobin 13.9 13.0 - 17.0 g/dL   HCT 60.9 60.9 - 47.9 %   MCV 85.5 80.0 - 100.0 fL   MCH 30.5 26.0 - 34.0 pg   MCHC 35.6 30.0 - 36.0 g/dL   RDW 86.6 88.4 - 84.4 %   Platelets 147 (L) 150 - 400 K/uL   nRBC 0.0 0.0 - 0.2 %   Neutrophils Relative % 56 %   Neutro Abs 3.8 1.7 - 7.7 K/uL   Lymphocytes Relative 35 %   Lymphs Abs 2.4 0.7 - 4.0 K/uL   Monocytes Relative 7 %   Monocytes Absolute 0.5 0.1 - 1.0 K/uL   Eosinophils Relative 2 %   Eosinophils Absolute 0.2 0.0 - 0.5 K/uL   Basophils Relative 0 %   Basophils Absolute 0.0 0.0 - 0.1  K/uL   Immature Granulocytes 0 %   Abs Immature Granulocytes 0.03 0.00 - 0.07 K/uL    Comment: Performed at [facility], [address], [place], [place] [address]  Comprehensive metabolic panel     Status: Abnormal   Collection Time: 02/02/24  6:50 AM  Result Value Ref Range   Sodium 134 (L) 135 - 145 mmol/L   Potassium 4.2 3.5 - 5.1 mmol/L   Chloride 102 98 - 111 mmol/L   CO2 23 22 - 32 mmol/L   Glucose, Bld 86 70 - 99 mg/dL    Comment: Glucose reference range applies only to samples taken after fasting for at least 8 hours.   BUN 17 8 - 23 mg/dL   Creatinine, Ser 9.04 0.61 - 1.24 mg/dL   Calcium 9.2 8.9 - 89.6 mg/dL   Total Protein 5.4 (L) 6.5 - 8.1 g/dL   Albumin 3.0 (L) 3.5 - 5.0 g/dL   AST 30 15 - 41 U/L   ALT 101 (H) 0 - 44 U/L   Alkaline Phosphatase 326 (H) 38 - 126 U/L   Total Bilirubin 0.5 0.0 - 1.2 mg/dL   GFR, Estimated >39 >39 mL/min    Comment: (NOTE) Calculated using the CKD-EPI Creatinine Equation (2021)    Anion gap  9 5 - 15    Comment: Performed at [facility], [address] [place], [place] [address]  Lipase, blood     Status: Abnormal   Collection Time: 02/02/24  6:50 AM  Result Value Ref Range   Lipase 1,193 (H) 11 - 51 U/L    Comment: Performed at [facility], [address] [place], [place] [address]    No results found.  Review of Systems  Constitutional:  Positive for appetite change.  HENT: Negative.    Eyes:        Icterus  Respiratory: Negative.    Cardiovascular: Negative.   Gastrointestinal:        Abdominal pain resolved  Musculoskeletal:        Back pain resolved  Neurological: Negative.   Hematological: Negative.   Psychiatric/Behavioral: Negative.     Blood pressure (!) 150/88, pulse (!) 56, temperature 97.9 F (36.6 C), temperature source Oral, resp. rate 14, weight 91.9 kg, SpO2 94%. Physical Exam Eyes:     General: Scleral icterus present.  Cardiovascular:     Rate and Rhythm: Normal rate and  regular rhythm.  Pulmonary:     Effort: Pulmonary effort is normal.     Breath sounds: No wheezing.  Abdominal:     Palpations: Abdomen is soft.     Tenderness: There is no abdominal tenderness. There is no guarding or rebound.  Skin:    General: Skin is warm.  Neurological:     Mental Status: He is alert and oriented to person, place, and time.  Psychiatric:        Mood and Affect: Mood normal.     Assessment/Plan: Biliary pancreatitis -status post ERCP. We will follow and plan laparoscopic cholecystectomy once pancreatitis improves.  Agree with low fat diet. Also noted plan by Dr. Perri to obtain an echocardiogram.  I discussed the plan with the patient and his family and answered their questions.  Eddie [name] 02/03/2024, 1:14 PM         [1] No Known Allergies

## 2024-02-03 NOTE — Plan of Care (Signed)
" °  Problem: Education: Goal: Knowledge of General Education information will improve Description: Including pain rating scale, medication(s)/side effects and non-pharmacologic comfort measures 02/03/2024 0407 by Yvone Hedge, RN Outcome: Progressing 02/03/2024 0406 by Yvone Hedge, RN Outcome: Progressing   Problem: Health Behavior/Discharge Planning: Goal: Ability to manage health-related needs will improve 02/03/2024 0407 by Yvone Hedge, RN Outcome: Progressing 02/03/2024 0406 by Yvone Hedge, RN Outcome: Progressing   Problem: Clinical Measurements: Goal: Ability to maintain clinical measurements within normal limits will improve 02/03/2024 0407 by Yvone Hedge, RN Outcome: Progressing 02/03/2024 0406 by Yvone Hedge, RN Outcome: Progressing Goal: Will remain free from infection 02/03/2024 0407 by Yvone Hedge, RN Outcome: Progressing 02/03/2024 0406 by Yvone Hedge, RN Outcome: Progressing Goal: Diagnostic test results will improve 02/03/2024 0407 by Yvone Hedge, RN Outcome: Progressing 02/03/2024 0406 by Yvone Hedge, RN Outcome: Progressing Goal: Respiratory complications will improve 02/03/2024 0407 by Yvone Hedge, RN Outcome: Progressing 02/03/2024 0406 by Yvone Hedge, RN Outcome: Progressing Goal: Cardiovascular complication will be avoided 02/03/2024 0407 by Yvone Hedge, RN Outcome: Progressing 02/03/2024 0406 by Yvone Hedge, RN Outcome: Progressing   Problem: Activity: Goal: Risk for activity intolerance will decrease 02/03/2024 0407 by Yvone Hedge, RN Outcome: Progressing 02/03/2024 0406 by Yvone Hedge, RN Outcome: Progressing   Problem: Nutrition: Goal: Adequate nutrition will be maintained 02/03/2024 0407 by Yvone Hedge, RN Outcome: Progressing 02/03/2024 0406 by Yvone Hedge, RN Outcome: Progressing   Problem: Coping: Goal: Level of anxiety will decrease 02/03/2024 0407 by Yvone Hedge, RN Outcome: Progressing 02/03/2024 0406 by Yvone Hedge, RN Outcome: Progressing   Problem: Elimination: Goal: Will not experience complications related to bowel motility 02/03/2024 0407 by Yvone Hedge, RN Outcome: Progressing 02/03/2024 0406 by Yvone Hedge, RN Outcome: Progressing Goal: Will not experience complications related to urinary retention 02/03/2024 0407 by Yvone Hedge, RN Outcome: Progressing 02/03/2024 0406 by Yvone Hedge, RN Outcome: Progressing   Problem: Pain Managment: Goal: General experience of comfort will improve and/or be controlled 02/03/2024 0407 by Yvone Hedge, RN Outcome: Progressing 02/03/2024 0406 by Yvone Hedge, RN Outcome: Progressing   Problem: Safety: Goal: Ability to remain free from injury will improve 02/03/2024 0407 by Yvone Hedge, RN Outcome: Progressing 02/03/2024 0406 by Yvone Hedge, RN Outcome: Progressing   Problem: Skin Integrity: Goal: Risk for impaired skin integrity will decrease 02/03/2024 0407 by Yvone Hedge, RN Outcome: Progressing 02/03/2024 0406 by Yvone Hedge, RN Outcome: Progressing   "

## 2024-02-03 NOTE — Anesthesia Preprocedure Evaluation (Signed)
"                                    Anesthesia Evaluation  Patient identified by MRN, date of birth, ID band Patient awake    Reviewed: Allergy & Precautions, H&P , NPO status , Patient's Chart, lab work & pertinent test results  Airway Mallampati: II   Neck ROM: full    Dental   Pulmonary Current Smoker   breath sounds clear to auscultation       Cardiovascular hypertension,  Rhythm:regular Rate:Normal     Neuro/Psych    GI/Hepatic ,,,(+)     substance abuse  alcohol use  Endo/Other    Renal/GU      Musculoskeletal   Abdominal   Peds  Hematology   Anesthesia Other Findings   Reproductive/Obstetrics                              Anesthesia Physical Anesthesia Plan  ASA: 2  Anesthesia Plan: General   Post-op Pain Management:    Induction: Intravenous  PONV Risk Score and Plan: 1 and Ondansetron , Dexamethasone , Midazolam  and Treatment may vary due to age or medical condition  Airway Management Planned: Oral ETT  Additional Equipment:   Intra-op Plan:   Post-operative Plan: Extubation in OR  Informed Consent: I have reviewed the patients History and Physical, chart, labs and discussed the procedure including the risks, benefits and alternatives for the proposed anesthesia with the patient or authorized representative who has indicated his/her understanding and acceptance.     Dental advisory given  Plan Discussed with: CRNA, Anesthesiologist and Surgeon  Anesthesia Plan Comments:         Anesthesia Quick Evaluation  "

## 2024-02-04 ENCOUNTER — Inpatient Hospital Stay (HOSPITAL_COMMUNITY)

## 2024-02-04 ENCOUNTER — Encounter (HOSPITAL_COMMUNITY): Payer: Self-pay | Admitting: Gastroenterology

## 2024-02-04 DIAGNOSIS — R9431 Abnormal electrocardiogram [ECG] [EKG]: Secondary | ICD-10-CM | POA: Diagnosis not present

## 2024-02-04 LAB — CBC WITH DIFFERENTIAL/PLATELET
Abs Immature Granulocytes: 0.04 K/uL (ref 0.00–0.07)
Basophils Absolute: 0 K/uL (ref 0.0–0.1)
Basophils Relative: 0 %
Eosinophils Absolute: 0 K/uL (ref 0.0–0.5)
Eosinophils Relative: 0 %
HCT: 42.6 % (ref 39.0–52.0)
Hemoglobin: 15.4 g/dL (ref 13.0–17.0)
Immature Granulocytes: 0 %
Lymphocytes Relative: 15 %
Lymphs Abs: 1.6 K/uL (ref 0.7–4.0)
MCH: 30.9 pg (ref 26.0–34.0)
MCHC: 36.2 g/dL — ABNORMAL HIGH (ref 30.0–36.0)
MCV: 85.5 fL (ref 80.0–100.0)
Monocytes Absolute: 0.6 K/uL (ref 0.1–1.0)
Monocytes Relative: 5 %
Neutro Abs: 9 K/uL — ABNORMAL HIGH (ref 1.7–7.7)
Neutrophils Relative %: 80 %
Platelets: 160 K/uL (ref 150–400)
RBC: 4.98 MIL/uL (ref 4.22–5.81)
RDW: 13.2 % (ref 11.5–15.5)
WBC: 11.3 K/uL — ABNORMAL HIGH (ref 4.0–10.5)
nRBC: 0 % (ref 0.0–0.2)

## 2024-02-04 LAB — COMPREHENSIVE METABOLIC PANEL WITH GFR
ALT: 142 U/L — ABNORMAL HIGH (ref 0–44)
AST: 78 U/L — ABNORMAL HIGH (ref 15–41)
Albumin: 3.6 g/dL (ref 3.5–5.0)
Alkaline Phosphatase: 621 U/L — ABNORMAL HIGH (ref 38–126)
Anion gap: 9 (ref 5–15)
BUN: 13 mg/dL (ref 8–23)
CO2: 28 mmol/L (ref 22–32)
Calcium: 9.9 mg/dL (ref 8.9–10.3)
Chloride: 103 mmol/L (ref 98–111)
Creatinine, Ser: 1.02 mg/dL (ref 0.61–1.24)
GFR, Estimated: >60 mL/min
Glucose, Bld: 129 mg/dL — ABNORMAL HIGH (ref 70–99)
Potassium: 4.5 mmol/L (ref 3.5–5.1)
Sodium: 139 mmol/L (ref 135–145)
Total Bilirubin: 0.8 mg/dL (ref 0.0–1.2)
Total Protein: 6.5 g/dL (ref 6.5–8.1)

## 2024-02-04 LAB — ECHOCARDIOGRAM COMPLETE
Area-P 1/2: 3.91 cm2
Calc EF: 65 %
S' Lateral: 3 cm
Single Plane A2C EF: 62.4 %
Single Plane A4C EF: 66.2 %
Weight: 3273.39 [oz_av]

## 2024-02-04 LAB — PHOSPHORUS: Phosphorus: 3.8 mg/dL (ref 2.5–4.6)

## 2024-02-04 LAB — MAGNESIUM: Magnesium: 1.8 mg/dL (ref 1.7–2.4)

## 2024-02-04 LAB — LIPASE, BLOOD: Lipase: 608 U/L — ABNORMAL HIGH (ref 11–51)

## 2024-02-04 MED ORDER — SODIUM CHLORIDE 0.9 % IV SOLN
2.0000 g | INTRAVENOUS | Status: AC
  Administered 2024-02-05: 2 g via INTRAVENOUS
  Filled 2024-02-04: qty 20

## 2024-02-04 NOTE — Anesthesia Preprocedure Evaluation (Signed)
 "                                  Anesthesia Evaluation  Patient identified by MRN, date of birth, ID band Patient awake    Reviewed: Allergy & Precautions, H&P , NPO status , Patient's Chart, lab work & pertinent test results  History of Anesthesia Complications Negative for: history of anesthetic complications  Airway Mallampati: II  TM Distance: >3 FB Neck ROM: full    Dental no notable dental hx. (+) Missing, Edentulous Upper, Poor Dentition,    Pulmonary neg pulmonary ROS, Current Smoker and Patient abstained from smoking.   Pulmonary exam normal breath sounds clear to auscultation       Cardiovascular hypertension, (-) angina (-) Past MI Normal cardiovascular exam Rhythm:Regular Rate:Normal     Neuro/Psych negative neurological ROS     GI/Hepatic negative GI ROS,,,(+)     substance abuse  alcohol useLab Results      Component                Value               Date                                 K                        4.5                 02/04/2024                   CREATININE               1.02                02/04/2024                GFRNONAA                 >60                 02/04/2024                    GLUCOSE                  129 (H)             02/04/2024              Endo/Other  negative endocrine ROS    Renal/GU Lab Results      Component                Value               Date                             K                        4.0                 02/05/2024                  Cr 0.94  negative genitourinary   Musculoskeletal negative musculoskeletal ROS (+)    Abdominal  Peds  Hematology Lab Results      Component                Value               Date                      WBC                      11.3 (H)            02/04/2024                HGB                      15.4                02/04/2024                HCT                      42.6                02/04/2024                MCV                      85.5                 02/04/2024                PLT                      160                 02/04/2024              Anesthesia Other Findings NKDA  Reproductive/Obstetrics negative OB ROS                              Anesthesia Physical Anesthesia Plan  ASA: 2  Anesthesia Plan: General   Post-op Pain Management: Toradol  IV (intra-op)*, Tylenol  PO (pre-op)* and Precedex    Induction: Intravenous  PONV Risk Score and Plan: 1 and Ondansetron , Dexamethasone , Midazolam  and Treatment may vary due to age or medical condition  Airway Management Planned: Oral ETT  Additional Equipment: None  Intra-op Plan:   Post-operative Plan: Extubation in OR  Informed Consent: I have reviewed the patients History and Physical, chart, labs and discussed the procedure including the risks, benefits and alternatives for the proposed anesthesia with the patient or authorized representative who has indicated his/her understanding and acceptance.     Dental advisory given  Plan Discussed with: CRNA and Surgeon  Anesthesia Plan Comments:          Anesthesia Quick Evaluation  "

## 2024-02-04 NOTE — Plan of Care (Signed)

## 2024-02-04 NOTE — Plan of Care (Signed)
   Problem: Education: Goal: Knowledge of General Education information will improve Description Including pain rating scale, medication(s)/side effects and non-pharmacologic comfort measures Outcome: Progressing   Problem: Health Behavior/Discharge Planning: Goal: Ability to manage health-related needs will improve Outcome: Progressing

## 2024-02-04 NOTE — Progress Notes (Signed)
 [facility] Progress Note  Eddie [name] 64 y.o. Dec 09, 1960   Subjective: Denies abdominal pain. Tolerating solid food.  Objective: Vital signs: Vitals:   02/04/24 0555 02/04/24 0737  BP: (!) 143/77 (!) 140/75  Pulse: (!) 44 (!) 44  Resp: 18   Temp: 97.7 F (36.5 C) 97.8 F (36.6 C)  SpO2: 95% 96%    Physical Exam: Gen: alert, no acute distress, well-nourished HEENT: anicteric sclera CV: RRR Chest: CTA B Abd: soft, nontender, nondistended, +BS Ext: no edema  Lab Results: Recent Labs    02/03/24 1357 02/04/24 0629  NA 135 139  K 3.9 4.5  CL 98 103  CO2 26 28  GLUCOSE 192* 129*  BUN 12 13  CREATININE 1.13 1.02  CALCIUM 10.0 9.9  MG 1.9 1.8  PHOS 4.1 3.8   Recent Labs    02/03/24 1357 02/04/24 0629  AST 214* 78*  ALT 183* 142*  ALKPHOS 770* 621*  BILITOT 2.2* 0.8  PROT 6.7 6.5  ALBUMIN 3.6 3.6   Recent Labs    02/03/24 1357 02/04/24 0629  WBC 6.4 11.3*  NEUTROABS 5.9 9.0*  HGB 15.9 15.4  HCT 44.9 42.6  MCV 86.2 85.5  PLT 165 160      Assessment/Plan: Gallstone pancreatitis - s/p ERCP yesterday with removal of choledocholithiasis and sphincterotomy. Doing well post-ERCP. Lap chole scheduled for tomorrow. Diet per surgery. [facility] will sign off. Call if questions.    Jerrell JAYSON Sol 02/04/2024, 2:47 PM  Questions please call [phone] ID: Eddie [name], male   DOB: April 15, 1960, 64 y.o.   MRN: [id number]

## 2024-02-04 NOTE — Progress Notes (Signed)
 " PROGRESS NOTE    Eddie [name]  FMW:[id number] DOB: 1960-03-10 DOA: 01/31/2024 PCP: [facility]   Brief Narrative:  Eddie [name] is a 64 y.o. male with medical history significant for diverticulosis, hypertension who presented with acute on chronic abdominal pain, ongoing for weeks, patient was diagnosed with gallstone pancreatitis, admitted under hospitalist service, general surgery and GI consulted.  Status post ERCP.   Assessment & Plan:   Principal Problem:   Acute pancreatitis Active Problems:   Elevated liver enzymes   Essential hypertension   Alcohol abuse   Diverticulosis   AKI (acute kidney injury)   Choledocholithiasis   Erosive gastritis   Abnormal LFTs  Gallstone pancreatitis/choledocholithiasis, POA: MRCP with obstructing choledocholithiasis with intrahepatic and extrahepatic biliary dilatation.  Status post ERCP and extraction of the bile duct stone.  Patient pain is improving and so his lipase.  General surgery on board with the plan of laparoscopic cholecystectomy early next week.   Systemic Inflammatory Response Syndrome, POA: Hypothermia, leukocytosis at presentation, Possible sign of infection in setting of above however cholecystitis is ruled out and antibiotics were discontinued.   Gastritis/GERD: Noted on ERCP.  Continue PPI.  Follow pathology report.   Acute Kidney Injury: Secondary to above, improved.  Sinus Bradycardia: Asymptomatic.  Echo pending.   HTN: Holding amlodipine, benicar for now - BP still within normal range.  Monitor closely and reintroduce medications once blood pressure starts rising.   Hyponatremia: Resolved.   Dyslipidemia: Holding crestor due to elevated LFTs.  Etoh Abuse Patient reports last drink 1 week ago Continue CIWA, no signs of withdrawal.   DVT prophylaxis: SCDs Start: 02/01/24 0241   Code Status: Full Code  Family Communication:  None present at bedside.  Plan of care discussed with  patient in length and he/she verbalized understanding and agreed with it.  Status is: Inpatient Remains inpatient appropriate because: Needs  cholecystectomy   Estimated body mass index is 27.75 kg/m as calculated from the following:   Height as of 07/06/23: 6' (1.829 m).   Weight as of this encounter: 92.8 kg.    Nutritional Assessment: Body mass index is 27.75 kg/m.SABRA Seen by dietician.  I agree with the assessment and plan as outlined below: Nutrition Status:        . Skin Assessment: I have examined the patient's skin and I agree with the wound assessment as performed by the wound care RN as outlined below:    Consultants:  General surgery  Procedures:  As above  Antimicrobials:  Anti-infectives (From admission, onward)    Start     Dose/Rate Route Frequency Ordered Stop   02/01/24 2200  cefTRIAXone  (ROCEPHIN ) 2 g in sodium chloride  0.9 % 100 mL IVPB  Status:  Discontinued        2 g 200 mL/hr over 30 Minutes Intravenous Every 24 hours 02/01/24 0600 02/03/24 1151   02/01/24 1400  metroNIDAZOLE  (FLAGYL ) IVPB 500 mg  Status:  Discontinued        500 mg 100 mL/hr over 60 Minutes Intravenous Every 12 hours 02/01/24 0600 02/03/24 1151   02/01/24 0000  ciprofloxacin  (CIPRO ) IVPB 400 mg        400 mg 200 mL/hr over 60 Minutes Intravenous  Once 01/31/24 2355 02/01/24 0312   02/01/24 0000  metroNIDAZOLE  (FLAGYL ) IVPB 500 mg        500 mg 100 mL/hr over 60 Minutes Intravenous  Once 01/31/24 2355 02/01/24 0204  Subjective: Patient seen and examined, he is sitting at the edge of the bed, eating breakfast.  He is eating regular diet.  Has no complaints at all.  He is eager to get over with the surgery and go home.  Objective: Vitals:   02/04/24 0104 02/04/24 0500 02/04/24 0555 02/04/24 0737  BP: 128/79  (!) 143/77 (!) 140/75  Pulse: (!) 46  (!) 44 (!) 44  Resp: 16  18   Temp: 97.8 F (36.6 C)  97.7 F (36.5 C) 97.8 F (36.6 C)  TempSrc: Oral  Oral Oral   SpO2: 96%  95% 96%  Weight:  92.8 kg      Intake/Output Summary (Last 24 hours) at 02/04/2024 0748 Last data filed at 02/03/2024 2154 Gross per 24 hour  Intake 350 ml  Output 450 ml  Net -100 ml   Filed Weights   02/02/24 0403 02/03/24 0500 02/04/24 0500  Weight: 92.3 kg 91.9 kg 92.8 kg    Examination:  General exam: Appears calm and comfortable  Respiratory system: Clear to auscultation. Respiratory effort normal. Cardiovascular system: S1 & S2 heard, RRR. No JVD, murmurs, rubs, gallops or clicks. No pedal edema. Gastrointestinal system: Abdomen is nondistended, soft and nontender. No organomegaly or masses felt. Normal bowel sounds heard. Central nervous system: Alert and oriented. No focal neurological deficits. Extremities: Symmetric 5 x 5 power. Skin: No rashes, lesions or ulcers Psychiatry: Judgement and insight appear normal. Mood & affect appropriate.    Data Reviewed: I have personally reviewed following labs and imaging studies  CBC: Recent Labs  Lab 01/31/24 2041 02/01/24 0410 02/02/24 0650 02/03/24 1357 02/04/24 0629  WBC 17.4* 13.2* 7.0 6.4 11.3*  NEUTROABS  --   --  3.8 5.9 9.0*  HGB 16.1 15.3 13.9 15.9 15.4  HCT 45.3 42.8 39.0 44.9 42.6  MCV 87.1 85.8 85.5 86.2 85.5  PLT 170 149* 147* 165 160   Basic Metabolic Panel: Recent Labs  Lab 01/31/24 2041 02/01/24 0410 02/02/24 0650 02/03/24 1357 02/04/24 0629  NA 127* 129* 134* 135 139  K 4.9 4.6 4.2 3.9 4.5  CL 93* 95* 102 98 103  CO2 22 22 23 26 28   GLUCOSE 128* 97 86 192* 129*  BUN 32* 31* 17 12 13   CREATININE 2.41* 1.93* 0.95 1.13 1.02  CALCIUM 10.2 9.6 9.2 10.0 9.9  MG  --   --   --  1.9 1.8  PHOS  --   --   --  4.1 3.8   GFR: CrCl cannot be calculated (Unknown ideal weight.). Liver Function Tests: Recent Labs  Lab 01/31/24 2041 02/01/24 0410 02/02/24 0650 02/03/24 1357 02/04/24 0629  AST 129* 89* 30 214* 78*  ALT 230* 185* 101* 183* 142*  ALKPHOS 570* 487* 326* 770* 621*   BILITOT 1.7* 0.8 0.5 2.2* 0.8  PROT 6.9 6.4* 5.4* 6.7 6.5  ALBUMIN 3.7 3.3* 3.0* 3.6 3.6   Recent Labs  Lab 01/31/24 2041 02/01/24 0410 02/02/24 0650 02/03/24 1357 02/04/24 0629  LIPASE 1,693* 1,760* 1,193* 1,578* 608*   No results for input(s): AMMONIA in the last 168 hours. Coagulation Profile: No results for input(s): INR, PROTIME in the last 168 hours. Cardiac Enzymes: No results for input(s): CKTOTAL, CKMB, CKMBINDEX, TROPONINI in the last 168 hours. BNP (last 3 results) No results for input(s): PROBNP in the last 8760 hours. HbA1C: No results for input(s): HGBA1C in the last 72 hours. CBG: No results for input(s): GLUCAP in the last 168 hours. Lipid Profile: No  results for input(s): CHOL, HDL, LDLCALC, TRIG, CHOLHDL, LDLDIRECT in the last 72 hours. Thyroid  Function Tests: No results for input(s): TSH, T4TOTAL, FREET4, T3FREE, THYROIDAB in the last 72 hours. Anemia Panel: No results for input(s): VITAMINB12, FOLATE, FERRITIN, TIBC, IRON, RETICCTPCT in the last 72 hours. Sepsis Labs: No results for input(s): PROCALCITON, LATICACIDVEN in the last 168 hours.  Recent Results (from the past 240 hours)  Blood culture (routine x 2)     Status: None (Preliminary result)   Collection Time: 01/31/24 11:57 PM   Specimen: BLOOD  Result Value Ref Range Status   Specimen Description BLOOD SITE NOT SPECIFIED  Final   Special Requests   Final    BOTTLES DRAWN AEROBIC AND ANAEROBIC Blood Culture adequate volume   Culture   Final    NO GROWTH 2 DAYS Performed at [facility], [address] [place], [place] [address]    Report Status PENDING  Incomplete  Blood culture (routine x 2)     Status: None (Preliminary result)   Collection Time: 02/01/24 12:02 AM   Specimen: BLOOD  Result Value Ref Range Status   Specimen Description BLOOD RIGHT ANTECUBITAL  Final   Special Requests   Final    BOTTLES DRAWN AEROBIC AND  ANAEROBIC Blood Culture adequate volume   Culture   Final    NO GROWTH 2 DAYS Performed at [facility], [address], [place], [place] [address]    Report Status PENDING  Incomplete     Radiology Studies: No results found.  Scheduled Meds:  LORazepam   0-4 mg Oral Q12H   multivitamin with minerals  1 tablet Oral Daily   pantoprazole   40 mg Oral Daily   Continuous Infusions:   LOS: 3 days   Fredia Skeeter, MD [facility]  02/04/2024, 7:48 AM   *Please note that this is a verbal dictation therefore any spelling or grammatical errors are due to the Dragon Medical One system interpretation.  Please page via Amion and do not message via secure chat for urgent patient care matters. Secure chat can be used for non urgent patient care matters.  How to contact the [facility] Attending or Consulting provider 7A - 7P or covering provider during after hours 7P -7A, for this patient?  Check the care team in [facility] and look for a) attending/consulting [facility] provider listed and b) the [facility] team listed. Page or secure chat 7A-7P. Log into [email or web address] and use [place]'s universal password to access. If you do not have the password, please contact the hospital operator. Locate the [facility] provider you are looking for under [facility] and page to a number that you can be directly reached. If you still have difficulty reaching the provider, please page the [facility] (Director on Call) for the Hospitalists listed on amion for assistance.  "

## 2024-02-04 NOTE — Progress Notes (Signed)
 PHARMACY - PHYSICIAN COMMUNICATION CRITICAL VALUE ALERT - BLOOD CULTURE IDENTIFICATION (BCID)  Eddie [name] is an 64 y.o. male who presented to [facility] on 01/31/2024 with a chief complaint of abdominal pain, dx with gallstone pancreatitis, now s/p ERCP.  Assessment:  1 of 4 bottles gram positive rods, no BCID run on this organisms per micro lab policy  Name of physician (or Provider) Contacted: Dr. Vernon  Current antibiotics: none (ceftriaxone  pre-op 1/5)  Changes to prescribed antibiotics recommended:  Recommendations accepted by provider - no addition of antibiotics, organisms is likely a contaminant.  No results found for this or any previous visit.  Rocky Slade, PharmD, BCPS 02/04/2024  6:31 PM

## 2024-02-04 NOTE — Progress Notes (Signed)
" °  Echocardiogram 2D Echocardiogram has been performed.  Koleen KANDICE Popper, RDCS 02/04/2024, 11:21 AM "

## 2024-02-05 ENCOUNTER — Encounter (HOSPITAL_COMMUNITY): Payer: Self-pay | Admitting: Internal Medicine

## 2024-02-05 ENCOUNTER — Other Ambulatory Visit (HOSPITAL_COMMUNITY): Payer: Self-pay

## 2024-02-05 ENCOUNTER — Encounter (HOSPITAL_COMMUNITY): Admission: EM | Disposition: A | Payer: Self-pay | Source: Home / Self Care | Attending: Family Medicine

## 2024-02-05 ENCOUNTER — Inpatient Hospital Stay (HOSPITAL_COMMUNITY): Payer: Self-pay | Admitting: Anesthesiology

## 2024-02-05 HISTORY — PX: INDOCYANINE GREEN FLUORESCENCE IMAGING (ICG): SHX7595

## 2024-02-05 HISTORY — PX: CHOLECYSTECTOMY: SHX55

## 2024-02-05 LAB — CBC WITH DIFFERENTIAL/PLATELET
Abs Immature Granulocytes: 0.06 K/uL (ref 0.00–0.07)
Basophils Absolute: 0 K/uL (ref 0.0–0.1)
Basophils Relative: 0 %
Eosinophils Absolute: 0 K/uL (ref 0.0–0.5)
Eosinophils Relative: 0 %
HCT: 43.1 % (ref 39.0–52.0)
Hemoglobin: 15.4 g/dL (ref 13.0–17.0)
Immature Granulocytes: 1 %
Lymphocytes Relative: 13 %
Lymphs Abs: 1.5 K/uL (ref 0.7–4.0)
MCH: 31 pg (ref 26.0–34.0)
MCHC: 35.7 g/dL (ref 30.0–36.0)
MCV: 86.7 fL (ref 80.0–100.0)
Monocytes Absolute: 0.4 K/uL (ref 0.1–1.0)
Monocytes Relative: 4 %
Neutro Abs: 9.5 K/uL — ABNORMAL HIGH (ref 1.7–7.7)
Neutrophils Relative %: 82 %
Platelets: 181 K/uL (ref 150–400)
RBC: 4.97 MIL/uL (ref 4.22–5.81)
RDW: 13.3 % (ref 11.5–15.5)
WBC: 11.5 K/uL — ABNORMAL HIGH (ref 4.0–10.5)
nRBC: 0 % (ref 0.0–0.2)

## 2024-02-05 LAB — COMPREHENSIVE METABOLIC PANEL WITH GFR
ALT: 97 U/L — ABNORMAL HIGH (ref 0–44)
AST: 37 U/L (ref 15–41)
Albumin: 3.2 g/dL — ABNORMAL LOW (ref 3.5–5.0)
Alkaline Phosphatase: 468 U/L — ABNORMAL HIGH (ref 38–126)
Anion gap: 11 (ref 5–15)
BUN: 13 mg/dL (ref 8–23)
CO2: 22 mmol/L (ref 22–32)
Calcium: 9.5 mg/dL (ref 8.9–10.3)
Chloride: 102 mmol/L (ref 98–111)
Creatinine, Ser: 0.94 mg/dL (ref 0.61–1.24)
GFR, Estimated: >60 mL/min
Glucose, Bld: 88 mg/dL (ref 70–99)
Potassium: 4 mmol/L (ref 3.5–5.1)
Sodium: 135 mmol/L (ref 135–145)
Total Bilirubin: 0.7 mg/dL (ref 0.0–1.2)
Total Protein: 6 g/dL — ABNORMAL LOW (ref 6.5–8.1)

## 2024-02-05 MED ORDER — LACTATED RINGERS IV SOLN: INTRAVENOUS | Status: DC | PRN

## 2024-02-05 MED ORDER — FENTANYL CITRATE (PF) 100 MCG/2ML IJ SOLN
INTRAMUSCULAR | Status: AC
  Filled 2024-02-05: qty 2

## 2024-02-05 MED ORDER — MIDAZOLAM HCL 2 MG/2ML IJ SOLN
INTRAMUSCULAR | Status: AC
  Filled 2024-02-05: qty 2

## 2024-02-05 MED ORDER — SPY AGENT GREEN - (INDOCYANINE FOR INJECTION)
1.2500 mg | Freq: Once | INTRAMUSCULAR | Status: AC
  Administered 2024-02-05: 1.25 mg via INTRAVENOUS

## 2024-02-05 MED ORDER — CHLORHEXIDINE GLUCONATE 0.12 % MT SOLN
15.0000 mL | Freq: Once | OROMUCOSAL | Status: AC
  Administered 2024-02-05: 15 mL via OROMUCOSAL

## 2024-02-05 MED ORDER — KETOROLAC TROMETHAMINE 30 MG/ML IJ SOLN
INTRAMUSCULAR | Status: AC
  Filled 2024-02-05: qty 1

## 2024-02-05 MED ORDER — FENTANYL CITRATE (PF) 250 MCG/5ML IJ SOLN
INTRAMUSCULAR | Status: DC | PRN
  Administered 2024-02-05 (×2): 50 ug via INTRAVENOUS

## 2024-02-05 MED ORDER — LIDOCAINE 2% (20 MG/ML) 5 ML SYRINGE
INTRAMUSCULAR | Status: AC
  Filled 2024-02-05: qty 5

## 2024-02-05 MED ORDER — HYDROMORPHONE HCL 1 MG/ML IJ SOLN
INTRAMUSCULAR | Status: AC
  Filled 2024-02-05: qty 1

## 2024-02-05 MED ORDER — PROPOFOL 10 MG/ML IV BOLUS
INTRAVENOUS | Status: AC
  Filled 2024-02-05: qty 20

## 2024-02-05 MED ORDER — OXYCODONE HCL 5 MG PO TABS
5.0000 mg | ORAL_TABLET | Freq: Once | ORAL | Status: AC | PRN
  Administered 2024-02-05: 5 mg via ORAL

## 2024-02-05 MED ORDER — PHENYLEPHRINE 80 MCG/ML (10ML) SYRINGE FOR IV PUSH (FOR BLOOD PRESSURE SUPPORT)
PREFILLED_SYRINGE | INTRAVENOUS | Status: AC
  Filled 2024-02-05: qty 10

## 2024-02-05 MED ORDER — ACETAMINOPHEN 500 MG PO TABS
1000.0000 mg | ORAL_TABLET | Freq: Once | ORAL | Status: AC
  Administered 2024-02-05: 1000 mg via ORAL
  Filled 2024-02-05: qty 2

## 2024-02-05 MED ORDER — DEXAMETHASONE SOD PHOSPHATE PF 10 MG/ML IJ SOLN
INTRAMUSCULAR | Status: DC | PRN
  Administered 2024-02-05: 10 mg via INTRAVENOUS

## 2024-02-05 MED ORDER — ORAL CARE MOUTH RINSE: 15.0000 mL | Freq: Once | OROMUCOSAL | Status: AC

## 2024-02-05 MED ORDER — SUGAMMADEX SODIUM 200 MG/2ML IV SOLN
INTRAVENOUS | Status: DC | PRN
  Administered 2024-02-05: 200 mg via INTRAVENOUS

## 2024-02-05 MED ORDER — LACTATED RINGERS IV SOLN: INTRAVENOUS | Status: DC

## 2024-02-05 MED ORDER — ONDANSETRON HCL 4 MG/2ML IJ SOLN: 4.0000 mg | Freq: Once | INTRAMUSCULAR | Status: DC | PRN

## 2024-02-05 MED ORDER — ROCURONIUM BROMIDE 10 MG/ML (PF) SYRINGE
PREFILLED_SYRINGE | INTRAVENOUS | Status: DC | PRN
  Administered 2024-02-05: 10 mg via INTRAVENOUS
  Administered 2024-02-05: 60 mg via INTRAVENOUS

## 2024-02-05 MED ORDER — MIDAZOLAM HCL (PF) 2 MG/2ML IJ SOLN
INTRAMUSCULAR | Status: DC | PRN
  Administered 2024-02-05: 2 mg via INTRAVENOUS

## 2024-02-05 MED ORDER — HYDROMORPHONE HCL 1 MG/ML IJ SOLN
0.2500 mg | INTRAMUSCULAR | Status: DC | PRN
  Administered 2024-02-05: 0.5 mg via INTRAVENOUS

## 2024-02-05 MED ORDER — KETOROLAC TROMETHAMINE 30 MG/ML IJ SOLN: 30.0000 mg | Freq: Once | INTRAMUSCULAR | Status: DC | PRN

## 2024-02-05 MED ORDER — BUPIVACAINE-EPINEPHRINE (PF) 0.25% -1:200000 IJ SOLN
INTRAMUSCULAR | Status: AC
  Filled 2024-02-05: qty 30

## 2024-02-05 MED ORDER — PHENYLEPHRINE 80 MCG/ML (10ML) SYRINGE FOR IV PUSH (FOR BLOOD PRESSURE SUPPORT)
PREFILLED_SYRINGE | INTRAVENOUS | Status: DC | PRN
  Administered 2024-02-05: 160 ug via INTRAVENOUS

## 2024-02-05 MED ORDER — BUPIVACAINE HCL 0.25 % IJ SOLN
INTRAMUSCULAR | Status: DC | PRN
  Administered 2024-02-05: 6 mL

## 2024-02-05 MED ORDER — OXYCODONE HCL 5 MG/5ML PO SOLN: 5.0000 mg | Freq: Once | ORAL | Status: DC | PRN

## 2024-02-05 MED ORDER — LIDOCAINE 2% (20 MG/ML) 5 ML SYRINGE
INTRAMUSCULAR | Status: DC | PRN
  Administered 2024-02-05: 100 mg via INTRAVENOUS

## 2024-02-05 MED ORDER — OXYCODONE HCL 5 MG PO TABS
ORAL_TABLET | ORAL | Status: AC
  Filled 2024-02-05: qty 1

## 2024-02-05 MED ORDER — DEXMEDETOMIDINE HCL IN NACL 80 MCG/20ML IV SOLN
INTRAVENOUS | Status: DC | PRN
  Administered 2024-02-05: 8 ug via INTRAVENOUS

## 2024-02-05 MED ORDER — PROPOFOL 10 MG/ML IV BOLUS
INTRAVENOUS | Status: DC | PRN
  Administered 2024-02-05: 70 mg via INTRAVENOUS
  Administered 2024-02-05: 120 mg via INTRAVENOUS

## 2024-02-05 MED ORDER — EPHEDRINE 5 MG/ML INJ
INTRAVENOUS | Status: AC
  Filled 2024-02-05: qty 5

## 2024-02-05 MED ORDER — ONDANSETRON HCL 4 MG/2ML IJ SOLN
INTRAMUSCULAR | Status: DC | PRN
  Administered 2024-02-05: 4 mg via INTRAVENOUS

## 2024-02-05 MED ORDER — OXYCODONE HCL 5 MG PO TABS
5.0000 mg | ORAL_TABLET | ORAL | 0 refills | Status: AC | PRN
  Filled 2024-02-05: qty 20, 4d supply, fill #0

## 2024-02-05 MED ORDER — OXYCODONE HCL 5 MG/5ML PO SOLN: 5.0000 mg | Freq: Once | ORAL | Status: AC | PRN

## 2024-02-05 MED ORDER — ONDANSETRON HCL 4 MG/2ML IJ SOLN: 4.0000 mg | Freq: Four times a day (QID) | INTRAMUSCULAR | Status: DC | PRN

## 2024-02-05 MED ORDER — FENTANYL CITRATE (PF) 100 MCG/2ML IJ SOLN: 25.0000 ug | INTRAMUSCULAR | Status: DC | PRN

## 2024-02-05 MED ORDER — EPHEDRINE SULFATE-NACL 50-0.9 MG/10ML-% IV SOSY
PREFILLED_SYRINGE | INTRAVENOUS | Status: DC | PRN
  Administered 2024-02-05 (×2): 10 mg via INTRAVENOUS

## 2024-02-05 MED ORDER — ROCURONIUM BROMIDE 10 MG/ML (PF) SYRINGE
PREFILLED_SYRINGE | INTRAVENOUS | Status: AC
  Filled 2024-02-05: qty 10

## 2024-02-05 MED ORDER — ONDANSETRON HCL 4 MG/2ML IJ SOLN
INTRAMUSCULAR | Status: AC
  Filled 2024-02-05: qty 2

## 2024-02-05 MED ORDER — DROPERIDOL 2.5 MG/ML IJ SOLN: 0.6250 mg | Freq: Once | INTRAMUSCULAR | Status: DC | PRN

## 2024-02-05 MED ORDER — OXYCODONE HCL 5 MG PO TABS: 5.0000 mg | ORAL_TABLET | Freq: Once | ORAL | Status: DC | PRN

## 2024-02-05 NOTE — Interval H&P Note (Signed)
 History and Physical Interval Note:  02/05/2024 7:44 AM  Eddie [name]  has presented today for surgery, with the diagnosis of cholecystits.  The various methods of treatment have been discussed with the patient and family. After consideration of risks, benefits and other options for treatment, the patient has consented to  Procedures: LAPAROSCOPIC CHOLECYSTECTOMY (N/A) INDOCYANINE GREEN  FLUORESCENCE IMAGING (ICG) (N/A) as a surgical intervention.  The patient's history has been reviewed, patient examined, no change in status, stable for surgery.  I have reviewed the patient's chart and labs.  Questions were answered to the patient's satisfaction.     Sherl Yzaguirre

## 2024-02-05 NOTE — Transfer of Care (Signed)
 Immediate Anesthesia Transfer of Care Note  Patient: Eddie [name]  Procedure(s) Performed: LAPAROSCOPIC CHOLECYSTECTOMY INDOCYANINE GREEN  FLUORESCENCE IMAGING (ICG)  Patient Location: PACU  Anesthesia Type:General  Level of Consciousness: drowsy, patient cooperative, and responds to stimulation  Airway & Oxygen Therapy: Patient Spontanous Breathing  Post-op Assessment: Report given to RN and Post -op Vital signs reviewed and stable  Post vital signs: Reviewed and stable  Last Vitals:  Vitals Value Taken Time  BP 111/74 02/05/24 09:05  Temp 36.6 C 02/05/24 09:05  Pulse 50 02/05/24 09:05  Resp 19 02/05/24 09:05  SpO2 95 % 02/05/24 09:05  Vitals shown include unfiled device data.  Last Pain:  Vitals:   02/05/24 0905  TempSrc:   PainSc: Asleep      Patients Stated Pain Goal: 0 (02/05/24 0141)  Complications: There were no known notable events for this encounter.

## 2024-02-05 NOTE — Op Note (Signed)
"   02/05/2024  8:49 AM  PATIENT:  Eddie [name]  64 y.o. male  PRE-OPERATIVE DIAGNOSIS:  cholecystits, choledocholithiasis  POST-OPERATIVE DIAGNOSIS:  cholecystits, choledocholithiasis  PROCEDURE:  Procedures: LAPAROSCOPIC CHOLECYSTECTOMY (N/A) INDOCYANINE GREEN  FLUORESCENCE IMAGING (ICG) (N/A)  SURGEON:  Surgeons and Role:    DEWAINE Rubin Calamity, MD - Primary  ASSISTANTS: Waddell Collier, RNFA   ANESTHESIA:   local and general  EBL:  minimal   BLOOD ADMINISTERED:none  DRAINS: none   LOCAL MEDICATIONS USED:  BUPIVICAINE   SPECIMEN:  Source of Specimen:  gallbladder  DISPOSITION OF SPECIMEN:  PATHOLOGY  COUNTS:  YES  TOURNIQUET:  * No tourniquets in log *  DICTATION: .Dragon Dictation  EBL: <5cc   Complications: none   Counts: reported as correct x 2   Findings:chronically inflamed gallbladder  Indications for procedure: Pt is a 74M with RUQ pain and seen to have gallstones as well as stones in the CBD s/p ERCP   Details of the procedure: The patient was taken to the operating and placed in the supine position with bilateral SCDs in place. A time out was called and all facts were verified. A pneumoperitoneum was obtained via A Veress needle technique to a pressure of 14mm of mercury. A 5mm trochar was then placed in the right upper quadrant under visualization, and there were no injuries to any abdominal organs. A 11 mm port was then placed in the umbilical region after infiltrating with local anesthesia under direct visualization. A second epigastric port was placed under direct visualization.   The gallbladder was identified and retracted, the peritoneum was then sharply dissected from the gallbladder and this dissection was carried down to Calot's triangle. The cystic duct was identified and dissected circumferentially and seen going into the gallbladder 360.  The cystic artery was dissected away from the surrounding tissues.   The critical angle was obtained.     2  clips were placed proximally one distally and the cystic duct transected. The cystic artery was identified and 2 clips placed proximally and one distally and transected. We then proceeded to remove the gallbladder off the hepatic fossa with Bovie cautery. A retrieval bag was then placed in the abdomen and gallbladder placed in the bag. The hepatic fossa was then reexamined and hemostasis was achieved with Bovie cautery and was excellent at this portion of the case. The subhepatic fossa and perihepatic fossa was then irrigated until the effluent was clear. The specimen bag and specimen were removed from the abdominal cavity.  There was some bleeding on the raw surface of the liver.  Surgicel powder was sprayed over the liver bed.  The 11 mm trocar fascia was reapproximated with the Endo Close #1 Vicryl x2. The pneumoperitoneum was evacuated and all trochars removed under direct visulalization. The skin was then closed with 4-0 Monocryl and the skin dressed with Dermabond. The patient was awaken from general anesthesia and taken to the recovery room in stable condition.    PLAN OF CARE: Admit to inpatient   PATIENT DISPOSITION:  PACU - hemodynamically stable.   Delay start of Pharmacological VTE agent (>24hrs) due to surgical blood loss or risk of bleeding: not applicable  "

## 2024-02-05 NOTE — Anesthesia Postprocedure Evaluation (Signed)
"   Anesthesia Post Note  Patient: Eddie [name]  Procedure(s) Performed: LAPAROSCOPIC CHOLECYSTECTOMY INDOCYANINE GREEN  FLUORESCENCE IMAGING (ICG)     Patient location during evaluation: PACU Anesthesia Type: General Level of consciousness: awake and alert Pain management: pain level controlled Vital Signs Assessment: post-procedure vital signs reviewed and stable Respiratory status: spontaneous breathing, nonlabored ventilation, respiratory function stable and patient connected to nasal cannula oxygen Cardiovascular status: blood pressure returned to baseline and stable Postop Assessment: no apparent nausea or vomiting Anesthetic complications: no   There were no known notable events for this encounter.  Last Vitals:  Vitals:   02/05/24 0942 02/05/24 1106  BP: 94/77 101/70  Pulse: (!) 46 (!) 46  Resp: 18   Temp: 36.6 C 36.6 C  SpO2: 97% 94%    Last Pain:  Vitals:   02/05/24 1106  TempSrc: Oral  PainSc:                  Eddie [name]      "

## 2024-02-05 NOTE — Anesthesia Procedure Notes (Signed)
 Procedure Name: Intubation Date/Time: 02/05/2024 8:05 AM  Performed by: Oley Aleck LABOR, CRNAPre-anesthesia Checklist: Patient identified, Emergency Drugs available, Suction available and Patient being monitored Patient Re-evaluated:Patient Re-evaluated prior to induction Oxygen Delivery Method: Circle system utilized Preoxygenation: Pre-oxygenation with 100% oxygen Induction Type: IV induction Ventilation: Mask ventilation without difficulty and Oral airway inserted - appropriate to patient size Laryngoscope Size: Mac and 4 Grade View: Grade I Tube type: Oral Tube size: 7.5 mm Number of attempts: 1 Airway Equipment and Method: Stylet Placement Confirmation: ETT inserted through vocal cords under direct vision, positive ETCO2 and breath sounds checked- equal and bilateral Secured at: 23 cm Tube secured with: Tape Dental Injury: Teeth and Oropharynx as per pre-operative assessment  Comments: intubated by C. Geddy [place], CRNA; ebbs

## 2024-02-05 NOTE — Discharge Summary (Signed)
 Physician Discharge Summary  Eddie [name] FMW:[id number] DOB: 04/15/1960 DOA: 01/31/2024  PCP: [facility]  Admit date: 01/31/2024 Discharge date: 02/05/2024 30 Day Unplanned Readmission Risk Score    Flowsheet Row ED to Hosp-Admission (Current) from 01/31/2024 in [facility] CATH RECOVERY  30 Day Unplanned Readmission Risk Score (%) 8.39 Filed at 02/05/2024 1200    This score is the patient's risk of an unplanned readmission within 30 days of being discharged (0 -100%). The score is based on dignosis, age, lab data, medications, orders, and past utilization.   Low:  0-14.9   Medium: 15-21.9   High: 22-29.9   Extreme: 30 and above          Admitted From: Home Disposition: Home  Recommendations for Outpatient Follow-up:  Follow up with PCP in 1-2 weeks Please obtain BMP/CBC in one week Follow-up with general surgery in 2 weeks Please follow up with your PCP on the following pending results: Unresulted Labs (From admission, onward)     Start     Ordered   02/05/24 0500  CBC with Differential/Platelet  Tomorrow morning,   R       Question:  Specimen collection method  Answer:  Lab=Lab collect   02/04/24 1041              Home Health: None Equipment/Devices: None  Discharge Condition:Stable CODE STATUS: Full code Diet recommendation:  Diet Order             Diet full liquid Room service appropriate? Yes; Fluid consistency: Thin  Diet effective now                   Subjective: Seen and examined after he returned from the surgery.  He had not tried any diet yet.  Complaining of pain at the surgical site.  Despite of not trying any diet, he was eager to go home and requested discharge.  I offered him to try a liquid diet and then try regular diet later tonight so we can discharge him tomorrow, also that he has the pain but he was adamant and wanted me to discharge him home saying that he was doing fine and he does not  anticipate any problems.  Brief/Interim Summary: Eddie [name] is a 64 y.o. male with medical history significant for diverticulosis, hypertension who presented with acute on chronic abdominal pain, ongoing for weeks, patient was diagnosed with gallstone pancreatitis, admitted under hospitalist service, general surgery and GI consulted.  Status post ERCP. further details below.  Gallstone pancreatitis/choledocholithiasis, POA: MRCP with obstructing choledocholithiasis with intrahepatic and extrahepatic biliary dilatation.  Status post ERCP and extraction of the bile duct stone.  General surgery consulted, patient underwent laparoscopic cholecystectomy today.  Still has pain but he says he can tolerate that.  He wants to go home.  I have prescribed him some oxycodone .   Systemic Inflammatory Response Syndrome, POA: Hypothermia, leukocytosis at presentation, Possible sign of infection in setting of above however cholecystitis is ruled out and antibiotics were discontinued.   Gastritis/GERD: Noted on ERCP.  Continue PPI.  Follow pathology report.   Acute Kidney Injury: Secondary to above, improved.   Sinus Bradycardia: Asymptomatic.  Echo with normal ejection fraction.   HTN: Holding amlodipine, benicar for now - BP either normal or on the low normal side.  Discontinuing amlodipine at discharge, resuming Benicar.   Hyponatremia: Resolved.   Dyslipidemia: Holding crestor for another week due to elevated LFTs.  Recommend  checking LFTs at PCPs office next week and resuming based on the results.  That is up to the discretion of PCP.  Etoh Abuse Patient reports last drink 1 week ago  no signs of withdrawal.  Discharge Diagnoses:  Principal Problem:   Acute pancreatitis Active Problems:   Elevated liver enzymes   Essential hypertension   Alcohol abuse   Diverticulosis   AKI (acute kidney injury)   Choledocholithiasis   Erosive gastritis   Abnormal LFTs    Discharge  Instructions   Allergies as of 02/05/2024   No Known Allergies      Medication List     PAUSE taking these medications    rosuvastatin 10 MG tablet Wait to take this until: February 12, 2024 Commonly known as: CRESTOR Take 10 mg by mouth daily.       STOP taking these medications    amLODipine 5 MG tablet Commonly known as: NORVASC       TAKE these medications    olmesartan 40 MG tablet Commonly known as: BENICAR Take 40 mg by mouth daily.   oxyCODONE  5 MG immediate release tablet Commonly known as: Oxy IR/ROXICODONE  Take 1 tablet (5 mg total) by mouth every 4 (four) hours as needed for moderate pain (pain score 4-6) or severe pain (pain score 7-10).   pantoprazole  40 MG tablet Commonly known as: PROTONIX  1 tablet 1/2 to 1 hour before morning meal Orally Once a day; Duration: 30 days        Follow-up Information     [facility] Follow up in 1 week(s).   Contact information: [address] [place] [address] [phone]                Allergies[1]  Consultations: GI and general surgery   Procedures/Studies: DG ERCP Result Date: 02/04/2024 CLINICAL DATA:  Patient has a history of acute pancreatitis and bile duct stones. EXAM: ERCP 5 intra procedural fluoroscopic images of the right upper quadrant TECHNIQUE: Multiple spot images obtained with the fluoroscopic device and submitted for interpretation post-procedure. FLUOROSCOPY: Radiation Exposure Index (as provided by the fluoroscopic device): 50 mGy Kerma COMPARISON:  None Available. FINDINGS: Images demonstrate flexible endoscopy device with guidewire in the common bile duct. Contrast injection demonstrates a filling defect in the moderately dilated common bile duct. Final image suggests balloon sweep of the common bile duct with a filling defect no longer identified. IMPRESSION: ERCP with balloon sweep of the moderately dilated common bile duct demonstrating  choledocholithiasis. These images were submitted for radiologic interpretation only. Please see the procedural report for the amount of contrast and the fluoroscopy time utilized. Electronically Signed   By: Cordella Banner   On: 02/04/2024 12:37   ECHOCARDIOGRAM COMPLETE Result Date: 02/04/2024    ECHOCARDIOGRAM REPORT   Patient Name:   Eddie [name] Date of Exam: 02/04/2024 Medical Rec #:  [id number]       Height:       72.0 in Accession #:    [id number]      Weight:       204.6 lb Date of Birth:  01-08-1961      BSA:          2.151 m Patient Age:    63 years        BP:           140/75 mmHg Patient Gender: M  HR:           47 bpm. Exam Location:  Inpatient Procedure: 2D Echo, Cardiac Doppler and Color Doppler (Both Spectral and Color            Flow Doppler were utilized during procedure). Indications:    Abnormal ECG R94.31  History:        Patient has no prior history of Echocardiogram examinations.                 Abnormal ECG, CKD, Arrythmias:Bradycardia; Risk Factors:Sleep                 Apnea, Hypertension, Current Smoker and Diabetes.  Sonographer:    Koleen Popper RDCS Referring Phys: A CALDWELL POWELL JR IMPRESSIONS  1. Left ventricular ejection fraction, by estimation, is 60 to 65%. The left ventricle has normal function. The left ventricle has no regional wall motion abnormalities. Left ventricular diastolic parameters were normal.  2. Right ventricular systolic function is normal. The right ventricular size is mildly enlarged. There is normal pulmonary artery systolic pressure.  3. The mitral valve is normal in structure. No evidence of mitral valve regurgitation. No evidence of mitral stenosis.  4. The aortic valve is normal in structure. Aortic valve regurgitation is not visualized. Aortic valve sclerosis/calcification is present, without any evidence of aortic stenosis.  5. Aortic dilatation noted. There is mild dilatation of the aortic root, measuring 39 mm.  6. The inferior vena  cava is dilated in size with >50% respiratory variability, suggesting right atrial pressure of 8 mmHg. FINDINGS  Left Ventricle: Left ventricular ejection fraction, by estimation, is 60 to 65%. The left ventricle has normal function. The left ventricle has no regional wall motion abnormalities. The left ventricular internal cavity size was normal in size. There is  no left ventricular hypertrophy. Left ventricular diastolic parameters were normal. Normal left ventricular filling pressure. Right Ventricle: The right ventricular size is mildly enlarged. No increase in right ventricular wall thickness. Right ventricular systolic function is normal. There is normal pulmonary artery systolic pressure. The tricuspid regurgitant velocity is 2.21  m/s, and with an assumed right atrial pressure of 8 mmHg, the estimated right ventricular systolic pressure is 27.5 mmHg. Left Atrium: Left atrial size was normal in size. Right Atrium: Right atrial size was normal in size. Pericardium: There is no evidence of pericardial effusion. Mitral Valve: The mitral valve is normal in structure. No evidence of mitral valve regurgitation. No evidence of mitral valve stenosis. Tricuspid Valve: The tricuspid valve is normal in structure. Tricuspid valve regurgitation is mild . No evidence of tricuspid stenosis. Aortic Valve: The aortic valve is normal in structure. Aortic valve regurgitation is not visualized. Aortic valve sclerosis/calcification is present, without any evidence of aortic stenosis. Pulmonic Valve: The pulmonic valve was normal in structure. Pulmonic valve regurgitation is not visualized. No evidence of pulmonic stenosis. Aorta: Aortic dilatation noted. There is mild dilatation of the aortic root, measuring 39 mm. Venous: The inferior vena cava is dilated in size with greater than 50% respiratory variability, suggesting right atrial pressure of 8 mmHg. IAS/Shunts: No atrial level shunt detected by color flow Doppler.  LEFT  VENTRICLE PLAX 2D LVIDd:         5.30 cm      Diastology LVIDs:         3.00 cm      LV e' medial:    12.40 cm/s LV PW:         0.90 cm  LV E/e' medial:  9.8 LV IVS:        1.10 cm      LV e' lateral:   12.40 cm/s LVOT diam:     2.30 cm      LV E/e' lateral: 9.8 LV SV:         123 LV SV Index:   57 LVOT Area:     4.15 cm  LV Volumes (MOD) LV vol d, MOD A2C: 130.0 ml LV vol d, MOD A4C: 229.0 ml LV vol s, MOD A2C: 48.9 ml LV vol s, MOD A4C: 77.3 ml LV SV MOD A2C:     81.1 ml LV SV MOD A4C:     229.0 ml LV SV MOD BP:      113.7 ml RIGHT VENTRICLE             IVC RV Basal diam:  4.10 cm     IVC diam: 2.10 cm RV S prime:     16.20 cm/s TAPSE (M-mode): 3.4 cm LEFT ATRIUM             Index        RIGHT ATRIUM           Index LA diam:        3.90 cm 1.81 cm/m   RA Area:     15.40 cm LA Vol (A2C):   55.2 ml 25.66 ml/m  RA Volume:   35.10 ml  16.32 ml/m LA Vol (A4C):   69.5 ml 32.31 ml/m LA Biplane Vol: 64.0 ml 29.75 ml/m  AORTIC VALVE LVOT Vmax:   121.00 cm/s LVOT Vmean:  75.000 cm/s LVOT VTI:    0.295 m  AORTA Ao Root diam: 3.90 cm MITRAL VALVE                TRICUSPID VALVE MV Area (PHT): 3.91 cm     TR Peak grad:   19.5 mmHg MV Decel Time: 194 msec     TR Vmax:        221.00 cm/s MV E velocity: 122.00 cm/s MV A velocity: 73.40 cm/s   SHUNTS MV E/A ratio:  1.66         Systemic VTI:  0.30 m                             Systemic Diam: 2.30 cm Wilbert Bihari MD Electronically signed by Wilbert Bihari MD Signature Date/Time: 02/04/2024/11:43:12 AM    Final    MR ABDOMEN MRCP W WO CONTRAST Result Date: 02/01/2024 EXAM: MRCP WITH AND WITHOUT IV CONTRAST 02/01/2024 06:16:04 AM TECHNIQUE: Multisequence, multiplanar magnetic resonance images of the abdomen with and without intravenous contrast. MRCP sequences were performed. 10 mL gadobutrol  (GADAVIST ) 1 MMOL/ML injection. COMPARISON: CT abdomen and pelvis 07/07/2023. CLINICAL HISTORY: The patient reports experiencing upper abdominal pain and back pain intermittently for 2  months. The patient endorses vomiting. Abnormal elevated lipase of 1693. FINDINGS: LIVER: Equivocal hepatic steatosis. No suspicious enhancing liver lesion. GALLBLADDER AND BILIARY SYSTEM: Gallbladder wall thickening without surrounding inflammatory fat stranding measuring 4 mm. The common bile duct is dilated and there is intrahepatic bile duct dilatation. The CBD measures up to 1.4 cm. Within the distal common bile duct there is a large stone which measures 1.5 x 1.1 cm, image 17/3. Fluid signal intensity. SPLEEN: Unremarkable. PANCREAS/PANCREATIC DUCT: Visualized pancreas is unremarkable. No significant main duct dilatation. No pancreatic mass identified. No signs of pseudocyst or pancreatic  necrosis. ADRENAL GLANDS: Unremarkable. KIDNEYS: Bosniak class I left kidney cyst measures 7 mm, image 21/4. LYMPH NODES: No enlarged abdominal lymph nodes. VASCULATURE: Unremarkable. PERITONEUM: No ascites. ABDOMINAL WALL: No hernia. No mass. 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 Grossly unremarkable. No bowel obstruction. BONES: No acute abnormality or worrisome osseous lesion. SOFT TISSUES: Unremarkable. LUNGS: Atelectasis or consolidation noted within the right lung base. MISCELLANEOUS: The most likely differential diagnosis based on these findings is choledocholithiasis with secondary cholangitis and/or pancreatitis. Follow-up guidelines would typically involve endoscopic retrograde cholangiopancreatography (ERCP) for stone extraction and/or cholecystectomy. IMPRESSION: 1. Obstructing distal choledocholithiasis with intrahepatic and extrahepatic biliary dilatation (CBD up to 1.4 cm), most consistent with gallstone pancreatitis in the setting of elevated lipase. 2. Gallbladder wall thickening (4 mm) without pericholecystic inflammatory fat stranding, which may be reactive. 3. No pancreatic mass, pseudocyst, necrosis, or significant main pancreatic duct  dilatation. Electronically signed by: Waddell Calk MD 02/01/2024 06:49 AM EST RP Workstation: [id number]   MR 3D Recon At Scanner Result Date: 02/01/2024 EXAM: MRCP WITH AND WITHOUT IV CONTRAST 02/01/2024 06:16:04 AM TECHNIQUE: Multisequence, multiplanar magnetic resonance images of the abdomen with and without intravenous contrast. MRCP sequences were performed. 10 mL gadobutrol  (GADAVIST ) 1 MMOL/ML injection. COMPARISON: CT abdomen and pelvis 07/07/2023. CLINICAL HISTORY: The patient reports experiencing upper abdominal pain and back pain intermittently for 2 months. The patient endorses vomiting. Abnormal elevated lipase of 1693. FINDINGS: LIVER: Equivocal hepatic steatosis. No suspicious enhancing liver lesion. GALLBLADDER AND BILIARY SYSTEM: Gallbladder wall thickening without surrounding inflammatory fat stranding measuring 4 mm. The common bile duct is dilated and there is intrahepatic bile duct dilatation. The CBD measures up to 1.4 cm. Within the distal common bile duct there is a large stone which measures 1.5 x 1.1 cm, image 17/3. Fluid signal intensity. SPLEEN: Unremarkable. PANCREAS/PANCREATIC DUCT: Visualized pancreas is unremarkable. No significant main duct dilatation. No pancreatic mass identified. No signs of pseudocyst or pancreatic necrosis. ADRENAL GLANDS: Unremarkable. KIDNEYS: Bosniak class I left kidney cyst measures 7 mm, image 21/4. LYMPH NODES: No enlarged abdominal lymph nodes. VASCULATURE: Unremarkable. PERITONEUM: No ascites. ABDOMINAL WALL: No hernia. No mass. 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 Grossly unremarkable. No bowel obstruction. BONES: No acute abnormality or worrisome osseous lesion. SOFT TISSUES: Unremarkable. LUNGS: Atelectasis or consolidation noted within the right lung base. MISCELLANEOUS: The most likely differential diagnosis based on these findings is choledocholithiasis with secondary  cholangitis and/or pancreatitis. Follow-up guidelines would typically involve endoscopic retrograde cholangiopancreatography (ERCP) for stone extraction and/or cholecystectomy. IMPRESSION: 1. Obstructing distal choledocholithiasis with intrahepatic and extrahepatic biliary dilatation (CBD up to 1.4 cm), most consistent with gallstone pancreatitis in the setting of elevated lipase. 2. Gallbladder wall thickening (4 mm) without pericholecystic inflammatory fat stranding, which may be reactive. 3. No pancreatic mass, pseudocyst, necrosis, or significant main pancreatic duct dilatation. Electronically signed by: Waddell Calk MD 02/01/2024 06:49 AM EST RP Workstation: [id number]   US  Abdomen Limited RUQ (LIVER/GB) Result Date: 02/01/2024 EXAM: Right Upper Quadrant Abdominal Ultrasound 01/31/2024 11:58:00 PM TECHNIQUE: Real-time ultrasonography of the right upper quadrant of the abdomen was performed. COMPARISON: CT abdomen and pelvis 07/07/2023. CLINICAL HISTORY: Abdominal pain. FINDINGS: LIVER: Increased echogenicity throughout the liver. No intrahepatic biliary ductal dilatation. No evidence of mass. Hepatopetal flow in the portal vein. BILIARY SYSTEM: Gallbladder wall is mildly thickened measuring 3.8 mm. Sonographic Beverley sign is negative. No cholelithiasis. Common bile duct measures 6.7  mm. RIGHT KIDNEY: No hydronephrosis. No echogenic calculi. No mass. PANCREAS: Visualized portions of the pancreas are unremarkable. OTHER: No right upper quadrant ascites. IMPRESSION: 1. Mildly thickened gallbladder wall measuring 3.8 mm with negative sonographic Murphy sign for, nonspecific. 2. Increased echogenicity throughout the liver compatible with the facet of the osseous disease such as fatty infiltration . 3. Common bile duct measures 6.7 mm, borderline dilated. Recommend correlation with lab values. Electronically signed by: Greig Pique MD 02/01/2024 12:25 AM EST RP Workstation: [id number]     Discharge  Exam: Vitals:   02/05/24 0942 02/05/24 1106  BP: 94/77 101/70  Pulse: (!) 46 (!) 46  Resp: 18   Temp: 97.8 F (36.6 C) 97.8 F (36.6 C)  SpO2: 97% 94%   Vitals:   02/05/24 0915 02/05/24 0930 02/05/24 0942 02/05/24 1106  BP: 95/70 103/62 94/77 101/70  Pulse: (!) 47 (!) 45 (!) 46 (!) 46  Resp: 15 14 18    Temp:   97.8 F (36.6 C) 97.8 F (36.6 C)  TempSrc:    Oral  SpO2: 93% 90% 97% 94%  Weight:      Height:        General: Pt is alert, awake, not in acute distress Cardiovascular: RRR, S1/S2 +, no rubs, no gallops Respiratory: CTA bilaterally, no wheezing, no rhonchi Abdominal: Soft, right upper quadrant tenderness, ND, bowel sounds + Extremities: no edema, no cyanosis    The results of significant diagnostics from this hospitalization (including imaging, microbiology, ancillary and laboratory) are listed below for reference.     Microbiology: Recent Results (from the past 240 hours)  Blood culture (routine x 2)     Status: None (Preliminary result)   Collection Time: 01/31/24 11:57 PM   Specimen: BLOOD  Result Value Ref Range Status   Specimen Description BLOOD SITE NOT SPECIFIED  Final   Special Requests   Final    BOTTLES DRAWN AEROBIC AND ANAEROBIC Blood Culture adequate volume   Culture  Setup Time   Final    GRAM POSITIVE RODS ANAEROBIC BOTTLE ONLY CRITICAL RESULT CALLED TO, READ BACK BY AND VERIFIED WITH: PHARMD ERIN W. [id number] AT 1830, ADC    Culture   Final    CULTURE REINCUBATED FOR BETTER GROWTH Performed at [facility], [address] [place], [place] [address]    Report Status PENDING  Incomplete  Blood culture (routine x 2)     Status: None (Preliminary result)   Collection Time: 02/01/24 12:02 AM   Specimen: BLOOD  Result Value Ref Range Status   Specimen Description BLOOD RIGHT ANTECUBITAL  Final   Special Requests   Final    BOTTLES DRAWN AEROBIC AND ANAEROBIC Blood Culture adequate volume   Culture   Final    NO GROWTH 4  DAYS Performed at [facility], [address] [place], [place] [address]    Report Status PENDING  Incomplete     Labs: BNP (last 3 results) No results for input(s): BNP in the last 8760 hours. Basic Metabolic Panel: Recent Labs  Lab 02/01/24 0410 02/02/24 0650 02/03/24 1357 02/04/24 0629 02/05/24 0220  NA 129* 134* 135 139 135  K 4.6 4.2 3.9 4.5 4.0  CL 95* 102 98 103 102  CO2 22 23 26 28 22   GLUCOSE 97 86 192* 129* 88  BUN 31* 17 12 13 13   CREATININE 1.93* 0.95 1.13 1.02 0.94  CALCIUM 9.6 9.2 10.0 9.9 9.5  MG  --   --  1.9 1.8  --   PHOS  --   --  4.1 3.8  --    Liver Function Tests: Recent Labs  Lab 02/01/24 0410 02/02/24 0650 02/03/24 1357 02/04/24 0629 02/05/24 0220  AST 89* 30 214* 78* 37  ALT 185* 101* 183* 142* 97*  ALKPHOS 487* 326* 770* 621* 468*  BILITOT 0.8 0.5 2.2* 0.8 0.7  PROT 6.4* 5.4* 6.7 6.5 6.0*  ALBUMIN 3.3* 3.0* 3.6 3.6 3.2*   Recent Labs  Lab 01/31/24 2041 02/01/24 0410 02/02/24 0650 02/03/24 1357 02/04/24 0629  LIPASE 1,693* 1,760* 1,193* 1,578* 608*   No results for input(s): AMMONIA in the last 168 hours. CBC: Recent Labs  Lab 02/01/24 0410 02/02/24 0650 02/03/24 1357 02/04/24 0629 02/05/24 1037  WBC 13.2* 7.0 6.4 11.3* 11.5*  NEUTROABS  --  3.8 5.9 9.0* 9.5*  HGB 15.3 13.9 15.9 15.4 15.4  HCT 42.8 39.0 44.9 42.6 43.1  MCV 85.8 85.5 86.2 85.5 86.7  PLT 149* 147* 165 160 181   Cardiac Enzymes: No results for input(s): CKTOTAL, CKMB, CKMBINDEX, TROPONINI in the last 168 hours. BNP: Invalid input(s): POCBNP CBG: No results for input(s): GLUCAP in the last 168 hours. D-Dimer No results for input(s): DDIMER in the last 72 hours. Hgb A1c No results for input(s): HGBA1C in the last 72 hours. Lipid Profile No results for input(s): CHOL, HDL, LDLCALC, TRIG, CHOLHDL, LDLDIRECT in the last 72 hours. Thyroid  function studies No results for input(s): TSH, T4TOTAL, T3FREE,  THYROIDAB in the last 72 hours.  Invalid input(s): FREET3 Anemia work up No results for input(s): VITAMINB12, FOLATE, FERRITIN, TIBC, IRON, RETICCTPCT in the last 72 hours. Urinalysis    Component Value Date/Time   COLORURINE AMBER (A) 01/31/2024 2045   APPEARANCEUR HAZY (A) 01/31/2024 2045   LABSPEC 1.024 01/31/2024 2045   PHURINE 5.0 01/31/2024 2045   GLUCOSEU NEGATIVE 01/31/2024 2045   HGBUR NEGATIVE 01/31/2024 2045   BILIRUBINUR MODERATE (A) 01/31/2024 2045   KETONESUR NEGATIVE 01/31/2024 2045   PROTEINUR 30 (A) 01/31/2024 2045   NITRITE NEGATIVE 01/31/2024 2045   LEUKOCYTESUR TRACE (A) 01/31/2024 2045   Sepsis Labs Recent Labs  Lab 02/02/24 0650 02/03/24 1357 02/04/24 0629 02/05/24 1037  WBC 7.0 6.4 11.3* 11.5*   Microbiology Recent Results (from the past 240 hours)  Blood culture (routine x 2)     Status: None (Preliminary result)   Collection Time: 01/31/24 11:57 PM   Specimen: BLOOD  Result Value Ref Range Status   Specimen Description BLOOD SITE NOT SPECIFIED  Final   Special Requests   Final    BOTTLES DRAWN AEROBIC AND ANAEROBIC Blood Culture adequate volume   Culture  Setup Time   Final    GRAM POSITIVE RODS ANAEROBIC BOTTLE ONLY CRITICAL RESULT CALLED TO, READ BACK BY AND VERIFIED WITH: PHARMD ERIN W. [id number] AT 1830, ADC    Culture   Final    CULTURE REINCUBATED FOR BETTER GROWTH Performed at [facility], [address] [place], [place] [address]    Report Status PENDING  Incomplete  Blood culture (routine x 2)     Status: None (Preliminary result)   Collection Time: 02/01/24 12:02 AM   Specimen: BLOOD  Result Value Ref Range Status   Specimen Description BLOOD RIGHT ANTECUBITAL  Final   Special Requests   Final    BOTTLES DRAWN AEROBIC AND ANAEROBIC Blood Culture adequate volume   Culture   Final    NO GROWTH 4 DAYS Performed at [facility], [address], [place], [place] [address]    Report Status PENDING   Incomplete    FURTHER DISCHARGE INSTRUCTIONS:   Get Medicines reviewed and adjusted: Please take all your medications with you for your next visit with your Primary MD   Laboratory/radiological data: Please request your Primary MD to go over all hospital tests and procedure/radiological results at the follow up, please ask your Primary MD to get all Hospital records sent to his/her office.   In some cases, they will be blood work, cultures and biopsy results pending at the time of your discharge. Please request that your primary care M.D. goes through all the records of your hospital data and follows up on these results.   Also Note the following: If you experience worsening of your admission symptoms, develop shortness of breath, life threatening emergency, suicidal or homicidal thoughts you must seek medical attention immediately by calling 911 or calling your MD immediately  if symptoms less severe.   You must read complete instructions/literature along with all the possible adverse reactions/side effects for all the Medicines you take and that have been prescribed to you. Take any new Medicines after you have completely understood and accpet all the possible adverse reactions/side effects.    patient was instructed, not to drive, operate heavy machinery, perform activities at heights, swimming or participation in water activities or provide baby-sitting services while on Pain, Sleep and Anxiety Medications; until their outpatient Physician has advised to do so again. Also recommended to not to take more than prescribed Pain, Sleep and Anxiety Medications.  It is not advisable to combine anxiety, sleep and pain medications without talking with your primary care provider.     Wear Seat belts while driving.   Please note: You were cared for by a hospitalist during your hospital stay. Once you are discharged, your primary care physician will handle any further medical issues. Please note that  NO REFILLS for any discharge medications will be authorized once you are discharged, as it is imperative that you return to your primary care physician (or establish a relationship with a primary care physician if you do not have one) for your post hospital discharge needs so that they can reassess your need for medications and monitor your lab values  Time coordinating discharge: Over 30 minutes  SIGNED:   Fredia Skeeter, MD  [facility] 02/05/2024, 1:33 PM *Please note that this is a verbal dictation therefore any spelling or grammatical errors are due to the Dragon [employer] One system interpretation. If 7PM-7AM, please contact night-coverage [email or web address]     [1] No Known Allergies

## 2024-02-05 NOTE — Plan of Care (Signed)
   Problem: Education: Goal: Knowledge of General Education information will improve Description Including pain rating scale, medication(s)/side effects and non-pharmacologic comfort measures Outcome: Progressing   Problem: Health Behavior/Discharge Planning: Goal: Ability to manage health-related needs will improve Outcome: Progressing

## 2024-02-06 ENCOUNTER — Ambulatory Visit: Payer: Self-pay | Admitting: Gastroenterology

## 2024-02-06 ENCOUNTER — Encounter (HOSPITAL_COMMUNITY): Payer: Self-pay | Admitting: General Surgery

## 2024-02-06 LAB — SURGICAL PATHOLOGY

## 2024-02-06 LAB — CULTURE, BLOOD (ROUTINE X 2)
Culture  Setup Time: NO GROWTH
Culture: NO GROWTH
Special Requests: ADEQUATE
Special Requests: ADEQUATE

## 2024-02-06 NOTE — Anesthesia Postprocedure Evaluation (Signed)
"   Anesthesia Post Note  Patient: Eddie [name]  Procedure(s) Performed: ERCP, WITH INTERVENTION IF INDICATED SPHINCTEROTOMY, BILIARY DILATION, STRICTURE, BILE DUCT BIOPSY, SKIN, SUBCUTANEOUS TISSUE, OR MUCOUS MEMBRANE ERCP, WITH LITHROTRIPSY OR REMOVAL OF COMMON BILE DUCT CALCULUS USING BALLOON     Patient location during evaluation: PACU Anesthesia Type: General Level of consciousness: awake and alert Pain management: pain level controlled Vital Signs Assessment: post-procedure vital signs reviewed and stable Respiratory status: spontaneous breathing, nonlabored ventilation, respiratory function stable and patient connected to nasal cannula oxygen Cardiovascular status: blood pressure returned to baseline and stable Postop Assessment: no apparent nausea or vomiting Anesthetic complications: no   No notable events documented.  Last Vitals:  Vitals:   02/05/24 1106 02/05/24 1548  BP: 101/70 106/79  Pulse: (!) 46 62  Resp:  19  Temp: 36.6 C 36.7 C  SpO2: 94% 94%    Last Pain:  Vitals:   02/05/24 1548  TempSrc: Oral  PainSc:                  Wilborn Membreno S      "
